# Patient Record
Sex: Female | Born: 1965 | Race: White | Hispanic: No | Marital: Married | Smoking: Never smoker
Health system: Southern US, Community
[De-identification: ages and names within clinical notes are randomized; demographics above are authoritative.]

## PROBLEM LIST (undated history)

## (undated) DIAGNOSIS — K219 Gastro-esophageal reflux disease without esophagitis: Secondary | ICD-10-CM

## (undated) DIAGNOSIS — M359 Systemic involvement of connective tissue, unspecified: Secondary | ICD-10-CM

## (undated) DIAGNOSIS — C801 Malignant (primary) neoplasm, unspecified: Secondary | ICD-10-CM

## (undated) DIAGNOSIS — J45909 Unspecified asthma, uncomplicated: Secondary | ICD-10-CM

## (undated) DIAGNOSIS — G43909 Migraine, unspecified, not intractable, without status migrainosus: Secondary | ICD-10-CM

## (undated) DIAGNOSIS — Z9889 Other specified postprocedural states: Secondary | ICD-10-CM

## (undated) DIAGNOSIS — I471 Supraventricular tachycardia, unspecified: Secondary | ICD-10-CM

## (undated) DIAGNOSIS — R002 Palpitations: Secondary | ICD-10-CM

## (undated) DIAGNOSIS — R87629 Unspecified abnormal cytological findings in specimens from vagina: Secondary | ICD-10-CM

## (undated) DIAGNOSIS — R112 Nausea with vomiting, unspecified: Secondary | ICD-10-CM

## (undated) HISTORY — DX: Systemic involvement of connective tissue, unspecified (CMS HCC): M35.9

## (undated) HISTORY — DX: Gastro-esophageal reflux disease without esophagitis: K21.9

## (undated) HISTORY — DX: Malignant (primary) neoplasm, unspecified (CMS HCC): C80.1

## (undated) HISTORY — DX: Migraine, unspecified, not intractable, without status migrainosus: G43.909

## (undated) HISTORY — DX: Unspecified asthma, uncomplicated: J45.909

## (undated) HISTORY — DX: Palpitations: R00.2

## (undated) HISTORY — PX: WISDOM TOOTH EXTRACTION: SHX21

## (undated) HISTORY — DX: Supraventricular tachycardia: I47.1

## (undated) HISTORY — DX: Supraventricular tachycardia, unspecified: I47.10

## (undated) HISTORY — DX: Unspecified abnormal cytological findings in specimens from vagina: R87.629

---

## 1989-01-07 ENCOUNTER — Other Ambulatory Visit (HOSPITAL_COMMUNITY): Payer: Self-pay

## 2002-09-18 ENCOUNTER — Other Ambulatory Visit: Admission: RE | Admit: 2002-09-18 | Discharge: 2002-09-18 | Payer: Self-pay | Admitting: Otolaryngology

## 2009-12-12 ENCOUNTER — Ambulatory Visit (HOSPITAL_COMMUNITY): Admission: RE | Admit: 2009-12-12 | Discharge: 2009-12-12 | Payer: Self-pay | Admitting: Orthopedic Surgery

## 2012-03-02 ENCOUNTER — Emergency Department (HOSPITAL_COMMUNITY)
Admission: EM | Admit: 2012-03-02 | Discharge: 2012-03-02 | Disposition: A | Discharge status: Home / Self Care | Payer: PRIVATE HEALTH INSURANCE | Attending: Emergency Medicine | Admitting: Emergency Medicine

## 2012-03-02 ENCOUNTER — Encounter (HOSPITAL_COMMUNITY): Payer: Self-pay | Admitting: *Deleted

## 2012-03-02 ENCOUNTER — Other Ambulatory Visit: Payer: Self-pay

## 2012-03-02 DIAGNOSIS — R0602 Shortness of breath: Secondary | ICD-10-CM | POA: Insufficient documentation

## 2012-03-02 DIAGNOSIS — T59891A Toxic effect of other specified gases, fumes and vapors, accidental (unintentional), initial encounter: Secondary | ICD-10-CM | POA: Insufficient documentation

## 2012-03-02 DIAGNOSIS — Y990 Civilian activity done for income or pay: Secondary | ICD-10-CM | POA: Insufficient documentation

## 2012-03-02 DIAGNOSIS — Y9210 Unspecified residential institution as the place of occurrence of the external cause: Secondary | ICD-10-CM | POA: Insufficient documentation

## 2012-03-02 DIAGNOSIS — Y93E5 Activity, floor mopping and cleaning: Secondary | ICD-10-CM | POA: Insufficient documentation

## 2012-03-02 DIAGNOSIS — T65891A Toxic effect of other specified substances, accidental (unintentional), initial encounter: Secondary | ICD-10-CM | POA: Insufficient documentation

## 2012-03-02 DIAGNOSIS — T1490XA Injury, unspecified, initial encounter: Secondary | ICD-10-CM

## 2012-03-02 NOTE — ED Notes (Signed)
Dr Ignacia Palma states he gave and instructed patient on discharge care and follow up. Patient left ED ambulatory with steady gait.

## 2012-03-02 NOTE — ED Notes (Signed)
Pt was working in or and sprayed some cleaning chemicals pt began to experience a fast hr, rate was 225 on monitor in or, sob, "splotching" noted to chest area that moved toward that facial area, vagal maneuvers were done in hr with decrease in hr, pt states that she is feeling better now.

## 2012-03-02 NOTE — ED Provider Notes (Signed)
History     CSN: [id number]  Arrival date & time 03/02/12  1234   None     Chief Complaint  Patient presents with  . Allergic Reaction    (Consider location/radiation/quality/duration/timing/severity/associated sxs/prior treatment) Patient is a 46 y.o. female presenting with general illness. The history is provided by the patient. No language interpreter was used.  Illness  The current episode started today (Pt was working in the [employer] and the room  was being cleaned by sprayinwith a cleaning solution calle [id number].). The problem has been resolved (After inhaling the spray by accident, pt had rapid heart rate of 225 with BP 132/80.  She did Valsalva maneuver, and her heart rate slowed down.). The problem is moderate. Relieved by: Valsalva maneuver. Nothing aggravates the symptoms. Pertinent negatives include no fever. She has received no recent medical care.    History reviewed. No pertinent past medical history.  History reviewed. No pertinent past surgical history.  No family history on file.  History  Substance Use Topics  . Smoking status: Never Smoker   . Smokeless tobacco: Not on file  . Alcohol Use: No    OB History    Grav Para Term Preterm Abortions TAB SAB Ect Mult Living                  Review of Systems  Constitutional: Negative for fever and chills.  HENT: Negative.   Eyes: Negative.   Respiratory: Positive for shortness of breath.   Cardiovascular: Positive for palpitations.  Gastrointestinal: Negative.   Genitourinary: Negative.   Musculoskeletal: Negative.   Neurological: Negative.   Psychiatric/Behavioral: Negative.     Allergies  Review of patient's allergies indicates no known allergies.  Home Medications  No current outpatient prescriptions on file.  BP 113/81  Pulse 84  Temp 97.5 F (36.4 C)  Resp 20  SpO2 100%  Physical Exam  Constitutional: She is oriented to person, place, and time. She appears well-developed and  well-nourished. No distress.  HENT:  Head: Normocephalic and atraumatic.  Right Ear: External ear normal.  Left Ear: External ear normal.  Eyes: Conjunctivae normal and EOM are normal. Pupils are equal, round, and reactive to light.  Neck: Normal range of motion. Neck supple.  Cardiovascular: Normal rate, regular rhythm and normal heart sounds.   Pulmonary/Chest: Effort normal and breath sounds normal.  Abdominal: Soft. Bowel sounds are normal.  Musculoskeletal: Normal range of motion. She exhibits no edema and no tenderness.  Neurological: She is alert and oriented to person, place, and time.       No sensory or motor deficit.  Skin: Skin is warm and dry.  Psychiatric: She has a normal mood and affect. Her behavior is normal.    ED Course  Procedures (including critical care time)  1:05 PM  Date: 03/02/2012  Rate: 94  Rhythm: normal sinus rhythm and sinus arrhythmia  QRS Axis: right  Intervals: normal  ST/T Wave abnormalities: normal  Conduction Disutrbances:none  Narrative Interpretation: Borderline EKG  Old EKG Reviewed: none available   1:50 PM Pt seen --> physical exam performed.  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  Pt can safely be discharged and can return to work.   1. Inhalation injury         Carleene Cooper III, MD 03/03/12 [phone]

## 2012-07-11 ENCOUNTER — Encounter: Payer: Self-pay | Admitting: Cardiology

## 2012-07-19 ENCOUNTER — Encounter: Payer: Self-pay | Admitting: Cardiology

## 2012-07-19 DIAGNOSIS — R002 Palpitations: Secondary | ICD-10-CM | POA: Insufficient documentation

## 2012-07-20 ENCOUNTER — Ambulatory Visit (INDEPENDENT_AMBULATORY_CARE_PROVIDER_SITE_OTHER): Payer: 59 | Admitting: Cardiology

## 2012-07-20 ENCOUNTER — Encounter: Payer: Self-pay | Admitting: Cardiology

## 2012-07-20 VITALS — BP 102/67 | HR 77 | Ht 64.0 in | Wt 156.0 lb

## 2012-07-20 DIAGNOSIS — I471 Supraventricular tachycardia: Secondary | ICD-10-CM | POA: Insufficient documentation

## 2012-07-20 NOTE — Progress Notes (Signed)
   Clinical Summary Tara Gates is a 47 y.o.female referred for cardiology consultation by Dr. Reuel Boom. She works in the OR at [employer]. Describes two separate episodes of prolonged rapid palpitations, one in October, and another more recently in January. Heart rate greater than 200 beats per minute, each episode broken with vagal maneuvers, and most recent event recorded on telemetry, suggestive of AVNRT. No 12-lead available of the events.  She states that she felt lightheaded and short of breath with these episodes, no frank syncope. Has experienced very brief palpitations previously, but nothing as profound as these most recent events. She is not on any specific prescription medications. Has noted more of a sense of palpitations with caffeine, has tried to cut this out of her diet.  Otherwise reports no regular functional limitations.   No Known Allergies  Current Outpatient Prescriptions  Medication Sig Dispense Refill  . naproxen sodium (ANAPROX) 220 MG tablet Take 220 mg by mouth 2 (two) times daily as needed.       No current facility-administered medications for this visit.    Past Medical History  Diagnosis Date  . Palpitations   . PSVT (paroxysmal supraventricular tachycardia)     Recently documented    History reviewed. No pertinent past surgical history.  Family History  Problem Relation Age of Onset  . Heart attack Father     Cause of death age 39  . Heart attack Mother     Age 70    Social History Ms. Val reports that she has never smoked. She does not have any smokeless tobacco history on file. Ms. Milberger reports that she does not drink alcohol.  Review of Systems Seasonal allergies. Occasional Headaches. Negative except as outlined.  Physical Examination Filed Vitals:   07/20/12 1026  BP: 102/67  Pulse: 77   Filed Weights   07/20/12 1026  Weight: 156 lb (70.761 kg)   Normally nourished appearing. No acute distress. HEENT: Conjunctiva and lids  normal, oropharynx clear. Neck: Supple, no elevated JVP or carotid bruits, no thyromegaly. Lungs: Clear to auscultation, nonlabored breathing at rest. Cardiac: Regular rate and rhythm, no S3 or significant systolic murmur, no pericardial rub. Extremities: No pitting edema, distal pulses 2+. Skin: Warm and dry. Musculoskeletal: No kyphosis. Neuropsychiatric: Alert and oriented x3, affect grossly appropriate.   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 Each episode (October and January) broken by bearing down/vagal maneuvers. We discussed options for management, she already limits caffeine. She is not inclined to take regular medication at this point. An echocardiogram will be obtained to assess cardiac structure and function, and we will have her see Dr. Ladona Ridgel for EP opinion regarding RF ablation if her symptoms worsen. Otherwise for now continue observation.    Jonelle Sidle, M.D., F.A.C.C.

## 2012-07-20 NOTE — Assessment & Plan Note (Signed)
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 Each episode (October and January) broken by bearing down/vagal maneuvers. We discussed options for management, she already limits caffeine. She is not inclined to take regular medication at this point. An echocardiogram will be obtained to assess cardiac structure and function, and we will have her see Dr. Ladona Ridgel for EP opinion regarding RF ablation if her symptoms worsen. Otherwise for now continue observation.

## 2012-07-20 NOTE — Patient Instructions (Signed)
   Echo - [facility])  Referral to Dr. Ladona Ridgel  Continue all current medications. Follow up as needed

## 2012-07-26 ENCOUNTER — Ambulatory Visit (HOSPITAL_COMMUNITY)
Admission: RE | Admit: 2012-07-26 | Discharge: 2012-07-26 | Disposition: A | Discharge status: Home / Self Care | Payer: 59 | Source: Ambulatory Visit | Attending: Cardiology | Admitting: Cardiology

## 2012-07-26 DIAGNOSIS — R002 Palpitations: Secondary | ICD-10-CM | POA: Insufficient documentation

## 2012-07-26 DIAGNOSIS — I319 Disease of pericardium, unspecified: Secondary | ICD-10-CM

## 2012-07-26 DIAGNOSIS — I471 Supraventricular tachycardia, unspecified: Secondary | ICD-10-CM | POA: Insufficient documentation

## 2012-07-26 NOTE — Progress Notes (Signed)
*  PRELIMINARY RESULTS* Echocardiogram 2D Echocardiogram has been performed.  Conrad Shackelford 07/26/2012, 10:33 AM

## 2012-07-28 ENCOUNTER — Telehealth: Payer: Self-pay | Admitting: *Deleted

## 2012-07-28 NOTE — Telephone Encounter (Signed)
Message copied by Eustace Moore on Fri Jul 28, 2012  5:00 PM ------      Message from: Jonelle Sidle      Created: Fri Jul 28, 2012  9:21 AM       Reviewed report. Normal LVEF, no major valvular abnormalities, trivial pericardial effusion (doubt clinically significant). Keep plan for followup with Dr. Ladona Ridgel. ------

## 2012-07-28 NOTE — Telephone Encounter (Signed)
Patient informed. 

## 2012-09-04 ENCOUNTER — Ambulatory Visit: Payer: Self-pay | Admitting: Internal Medicine

## 2013-04-16 ENCOUNTER — Telehealth: Payer: Self-pay | Admitting: Cardiology

## 2013-04-16 MED ORDER — PROPRANOLOL HCL 10 MG PO TABS: 10.0000 mg | ORAL_TABLET | Freq: Three times a day (TID) | ORAL | Status: DC | PRN

## 2013-04-16 NOTE — Telephone Encounter (Signed)
Patient informed. 

## 2013-04-16 NOTE — Telephone Encounter (Signed)
Patient called to inform cardiologist that she had an episode for the 1st time since February 2014. Patient says her was ranging between HR 220-230. No c/o chest pain or sob. Patient is requesting medication to have on hand for as needed only. Patient informed nurse that she did not want to proceed with an ablation at this time and this is why she canceled her appointment with Dr. Ladona Ridgel back in April 2014.

## 2013-04-16 NOTE — Telephone Encounter (Signed)
Can try Propranolol 10 mg PO TID PRN palpitations. She should schedule follow-up sooner in symptoms progress.

## 2013-05-23 ENCOUNTER — Ambulatory Visit: Payer: Self-pay | Admitting: Internal Medicine

## 2013-05-29 ENCOUNTER — Encounter: Payer: Self-pay | Admitting: *Deleted

## 2013-07-25 DIAGNOSIS — M3130 Wegener's granulomatosis without renal involvement: Secondary | ICD-10-CM | POA: Insufficient documentation

## 2013-07-25 DIAGNOSIS — R490 Dysphonia: Secondary | ICD-10-CM | POA: Insufficient documentation

## 2014-07-29 ENCOUNTER — Encounter (HOSPITAL_COMMUNITY): Payer: Self-pay | Admitting: Emergency Medicine

## 2014-07-29 ENCOUNTER — Emergency Department (HOSPITAL_COMMUNITY): Payer: 59

## 2014-07-29 ENCOUNTER — Emergency Department (HOSPITAL_COMMUNITY)
Admission: EM | Admit: 2014-07-29 | Discharge: 2014-07-29 | Disposition: A | Discharge status: Home / Self Care | Payer: 59 | Attending: Emergency Medicine | Admitting: Emergency Medicine

## 2014-07-29 DIAGNOSIS — R000 Tachycardia, unspecified: Secondary | ICD-10-CM | POA: Diagnosis present

## 2014-07-29 DIAGNOSIS — I471 Supraventricular tachycardia: Secondary | ICD-10-CM | POA: Diagnosis not present

## 2014-07-29 DIAGNOSIS — J302 Other seasonal allergic rhinitis: Secondary | ICD-10-CM | POA: Diagnosis not present

## 2014-07-29 LAB — CBC WITH DIFFERENTIAL/PLATELET
BASOS PCT: 1 % (ref 0–1)
Basophils Absolute: 0.1 10*3/uL (ref 0.0–0.1)
EOS ABS: 0.1 10*3/uL (ref 0.0–0.7)
EOS PCT: 1 % (ref 0–5)
HCT: 37.9 % (ref 36.0–46.0)
Hemoglobin: 12.5 g/dL (ref 12.0–15.0)
LYMPHS ABS: 1.7 10*3/uL (ref 0.7–4.0)
Lymphocytes Relative: 18 % (ref 12–46)
MCH: 30.3 pg (ref 26.0–34.0)
MCHC: 33 g/dL (ref 30.0–36.0)
MCV: 92 fL (ref 78.0–100.0)
Monocytes Absolute: 0.6 10*3/uL (ref 0.1–1.0)
Monocytes Relative: 7 % (ref 3–12)
NEUTROS PCT: 73 % (ref 43–77)
Neutro Abs: 6.8 10*3/uL (ref 1.7–7.7)
PLATELETS: 236 10*3/uL (ref 150–400)
RBC: 4.12 MIL/uL (ref 3.87–5.11)
RDW: 13.1 % (ref 11.5–15.5)
WBC: 9.2 10*3/uL (ref 4.0–10.5)

## 2014-07-29 LAB — TROPONIN I

## 2014-07-29 LAB — COMPREHENSIVE METABOLIC PANEL
ALBUMIN: 3.5 g/dL (ref 3.5–5.2)
ALT: 12 U/L (ref 0–35)
ANION GAP: 7 (ref 5–15)
AST: 16 U/L (ref 0–37)
Alkaline Phosphatase: 64 U/L (ref 39–117)
BILIRUBIN TOTAL: 0.3 mg/dL (ref 0.3–1.2)
BUN: 13 mg/dL (ref 6–23)
CALCIUM: 8.8 mg/dL (ref 8.4–10.5)
CO2: 24 mmol/L (ref 19–32)
CREATININE: 0.91 mg/dL (ref 0.50–1.10)
Chloride: 108 mmol/L (ref 96–112)
GFR calc Af Amer: 86 mL/min — ABNORMAL LOW (ref >90)
GFR, EST NON AFRICAN AMERICAN: 74 mL/min — AB (ref >90)
Glucose, Bld: 90 mg/dL (ref 70–99)
Potassium: 3.8 mmol/L (ref 3.5–5.1)
SODIUM: 139 mmol/L (ref 135–145)
Total Protein: 6.9 g/dL (ref 6.0–8.3)

## 2014-07-29 LAB — TSH: TSH: 2.152 u[IU]/mL (ref 0.350–4.500)

## 2014-07-29 MED ORDER — METOPROLOL TARTRATE 25 MG PO TABS: 12.5000 mg | ORAL_TABLET | Freq: Two times a day (BID) | ORAL | Status: DC

## 2014-07-29 MED ORDER — ATENOLOL 25 MG PO TABS
25.0000 mg | ORAL_TABLET | Freq: Once | ORAL | Status: AC
  Administered 2014-07-29: 25 mg via ORAL
  Filled 2014-07-29: qty 1

## 2014-07-29 NOTE — Discharge Instructions (Signed)
Follow up with your cardiologist in 1-4 weeks

## 2014-07-29 NOTE — ED Notes (Addendum)
Pt reports was at work and reports "felt my heart rate increase and extremities go weak." pt alert and oriented. Pt denies pain, sob. Pt reports history of same. Pt reports bared down and blew through a straw prior to arrival. Pt reports is prescribed propanolol PRN but reports did not take dose prior to arrival.

## 2014-07-29 NOTE — Consult Note (Addendum)
CARDIOLOGY CONSULT NOTE   Patient ID: BENIGNA DELISI MRN: [id number] DOB/AGE: 1965-11-22 49 y.o.  Admit Date: 07/29/2014 Referring Physician: ER Primary Physician: Gar Ponto, MD Consulting Cardiologist: Kate Sable Primary Cardiologist Rozann Lesches MD Reason for Consultation: PSVT  Clinical Summary Ms. 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 Domenic Polite when seen last in March of 2014. She has not been seen since and has not had recurrences. She was at work this am when she had sudden onset of rapid HR, tingling and numbness in extremities, and dyspnea. She attached cardiac monitor and found her HR to up elevated up to 170-190. She has had episodes in the past up to >200. She occasionally feels HR go up at work, but it quickly returns to normal.   When assessed in PACU she has already been placed on O2 via mask. HR was down to 110 bpm. She was feeling better. No chest pain, no nausea or vomiting.    No Known Allergies  Medications Scheduled Medications:   Infusions:   PRN Medications:     Past Medical History  Diagnosis Date  . Palpitations   . PSVT (paroxysmal supraventricular tachycardia)     Recently documented    History reviewed. No pertinent past surgical history.  Family History  Problem Relation Age of Onset  . Heart attack Father     Cause of death age 67  . Heart attack Mother     Age 90    Social History Ms. Willets reports that she has never smoked. She does not have any smokeless tobacco history on file. Ms. Latendresse reports that she does not drink alcohol.  Review of Systems Complete review of systems are found to be negative unless outlined in H&P above.  Physical Examination Blood pressure 103/70, pulse 105, temperature 98.5 F (36.9 C), temperature source Oral, resp. rate 18, height 5\' 5"  (1.651 m), weight 150 lb (68.04 kg), last  menstrual period 06/30/2014, SpO2 100 %. No intake or output data in the 24 hours ending 07/29/14 1010  Telemetry: Sinus tachycardia rate of 90-110 bpm.   GEN: Resting no acute distress. Can still feel HR up.  HEENT: Conjunctiva and lids normal, oropharynx clear with moist mucosa. Neck: Supple, no elevated JVP or carotid bruits, questionable thyromegaly. Lungs: Clear to auscultation, nonlabored breathing at rest. Cardiac: Regular rate and rhythm, tachycardia, no S3 or significant systolic murmur, no pericardial rub. Abdomen: Soft, nontender, no hepatomegaly, bowel sounds present, no guarding or rebound. Extremities: No pitting edema, distal pulses 2+. Skin: Warm and dry. Musculoskeletal: No kyphosis. Neuropsychiatric: Alert and oriented x3, affect grossly appropriate.  Prior Cardiac Testing/Procedures 1.Echocardiogram 07/26/2012 Left ventricle: The cavity size was normal. Wall thickness was normal. The estimated ejection fraction was 60%. Wall motion was normal; there were no regional wall motion abnormalities. - Pericardium, extracardiac: Small pericardial effusion. There is slight fluid posteriorly. Small fluid along RA and RV.  Lab Results  Basic Metabolic Panel: No results for input(s): NA, K, CL, CO2, GLUCOSE, BUN, CREATININE, CALCIUM, MG, PHOS in the last 168 hours.  Liver Function Tests: No results for input(s): AST, ALT, ALKPHOS, BILITOT, PROT, ALBUMIN in the last 168 hours.  CBC:  Recent Labs Lab 07/29/14 0947  WBC 9.2  NEUTROABS 6.8  HGB 12.5  HCT 37.9  MCV 92.0  PLT 236    Radiology: Dg Chest 2 View  07/29/2014   CLINICAL DATA:  Tachycardia  EXAM: CHEST  2 VIEW  COMPARISON:  None.  FINDINGS: The heart size and mediastinal contours are within normal limits. Both lungs are clear. The visualized skeletal structures are unremarkable.  IMPRESSION: No active cardiopulmonary disease.   Electronically Signed   By: Inez Catalina M.D.   On: 07/29/2014 10:06       ECG: Sinus tachycardia rate of 100 bpm.   Impression and Recommendations  1. PSVT:  Occurred suddenly while at work in PACU. Has had episodes of this almost daily, but states that they are transient. Last severe episode 2 years ago. Has propanolol 10 mg to take prn but has not had to. Labs are normal, but TSH is pending. Will plan follow up with Dr. Domenic Polite and probable referral to EP. Can consider cardiac monitor as she states that she has these almost daily although they are not sustained.   2.Questionable thyromegaly: No trouble swallowing. Will check TSH.   Signed: Phill Myron. Lawrence NP AACC  07/29/2014, 10:10 AM Co-Sign MD  The patient was seen and examined, and I agree with the assessment and plan as documented above, with modifications as noted below. 49 yr old woman with no significant PMH other than PSVT and seasonal allergies found to be in SVT. She did not have time to take prn propranolol as it came on suddenly and she nearly passed out. She denies fevers/chills but has been bothered by seasonal allergies since Friday, and took Claritin yesterday. CXR, BMET, and CBC were unremarkable. She is scared and has not informed her husband or 2 daughters of today's events, but did call her mother who is here. I had a long discussion with her regarding various management options including the prn use of medications, scheduled medications, and EPS with ablation. 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 If she were to experience feelings of sluggishness/fatigue, I would then try metoprolol 12.5 mg daily. The patient is in agreement with this strategy for the time being.  She does not require hospitalization.

## 2014-07-29 NOTE — ED Provider Notes (Addendum)
CSN: [id number]     Arrival date & time 07/29/14  0902 History  This chart was scribed for Milton Ferguson, MD by Stephania Fragmin, ED Scribe. This patient was seen in room APA08/APA08 and the patient's care was started at 9:23 AM.   Chief Complaint  Patient presents with  . Tachycardia   Patient is a 49 y.o. female presenting with palpitations. The history is provided by the patient. No language interpreter was used.  Palpitations Palpitations quality:  Fast Onset quality:  Sudden Duration:  1 hour Timing:  Intermittent Progression:  Improving Chronicity:  Recurrent Context comment:  At work Relieved by:  Nothing Worsened by:  Nothing Ineffective treatments:  None tried Associated symptoms: weakness   Associated symptoms: no back pain, no chest pain and no cough      HPI Comments: DELCENIA INMAN is a 49 y.o. female with a history of PSVT with who presents to the Emergency Department complaining of sudden onset tachycardia at work that occurred PTA. 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 Patient reports regular, almost daily, brief episodes of tachycardia at work, but they are usually not as long-lasting nor does her pulse become as high as 150-170 bpm. She takes propanolol prn, but hasn't taken any because she hasn't had SVT in 2 years. She last saw her cardiologist, Dr. Domenic Polite, 2 years ago.  Past Medical History  Diagnosis Date  . Palpitations   . PSVT (paroxysmal supraventricular tachycardia)     Recently documented   History reviewed. No pertinent past surgical history. Family History  Problem Relation Age of Onset  . Heart attack Father     Cause of death age 103  . Heart attack Mother     Age 62   History  Substance Use Topics  . Smoking status: Never Smoker   . Smokeless tobacco: Not on file  . Alcohol Use: No   OB History    No data available      Review of Systems  Constitutional: Negative for appetite change and fatigue.  HENT: Negative for congestion, ear discharge and sinus pressure.   Eyes: Negative for discharge.  Respiratory: Negative for cough.   Cardiovascular: Positive for palpitations. Negative for chest pain.  Gastrointestinal: Negative for abdominal pain and diarrhea.  Genitourinary: Negative for frequency and hematuria.  Musculoskeletal: Negative for back pain.  Skin: Negative for rash.  Neurological: Positive for weakness. Negative for seizures and headaches.  Psychiatric/Behavioral: Negative for hallucinations.      Allergies  Review of patient's allergies indicates no known allergies.  Home Medications   Prior to Admission medications   Medication Sig Start Date End Date Taking? Authorizing Provider  naproxen sodium (ANAPROX) 220 MG tablet Take 220 mg by mouth 2 (two) times daily as needed.    Historical Provider, MD  propranolol (INDERAL) 10 MG tablet Take 1 tablet (10 mg total) by mouth 3 (three) times daily as needed. Palpitations 04/16/13   Satira Sark, MD   BP 109/77 mmHg  Pulse 110  Temp(Src) 98.5 F (36.9 C) (Oral)  Resp 18  Ht 5\' 5"  (1.651 m)  Wt 150 lb (68.04 kg)  BMI 24.96 kg/m2  SpO2 100%  LMP 06/30/2014 Physical Exam  Constitutional: She is oriented to person, place, and time. She appears well-developed.  HENT:  Head: Normocephalic.  Eyes: Conjunctivae and EOM are normal. No scleral  icterus.  Neck: Neck supple. No thyromegaly present.  Cardiovascular: Regular rhythm.  Tachycardia present.  Exam reveals no gallop and no friction rub.   No murmur heard. Mild tachycardia.  Pulmonary/Chest: No stridor. She has no wheezes. She has no rales. She exhibits no tenderness.  Abdominal: She exhibits no distension. There is no tenderness. There is no rebound.  Musculoskeletal: Normal range of motion. She exhibits no edema.  Lymphadenopathy:    She has no cervical adenopathy.   Neurological: She is oriented to person, place, and time. She exhibits normal muscle tone. Coordination normal.  Skin: No rash noted. No erythema.  Psychiatric: She has a normal mood and affect. Her behavior is normal.  Nursing note and vitals reviewed.   ED Course  Procedures (including critical care time)  DIAGNOSTIC STUDIES: Oxygen Saturation is 100% on room air, normal by my interpretation.    COORDINATION OF CARE: 9:26 AM - Discussed treatment plan with pt at bedside which includes atenolol, and pt agreed to plan.   Medications  atenolol (TENORMIN) tablet 25 mg (25 mg Oral Given 07/29/14 0942)    Labs Review Labs Reviewed  COMPREHENSIVE METABOLIC PANEL - Abnormal; Notable for the following:    GFR calc non Af Amer 74 (*)    GFR calc Af Amer 86 (*)    All other components within normal limits  CBC WITH DIFFERENTIAL/PLATELET  TROPONIN I  TSH    Imaging Review Dg Chest 2 View  07/29/2014   CLINICAL DATA:  Tachycardia  EXAM: CHEST  2 VIEW  COMPARISON:  None.  FINDINGS: The heart size and mediastinal contours are within normal limits. Both lungs are clear. The visualized skeletal structures are unremarkable.  IMPRESSION: No active cardiopulmonary disease.   Electronically Signed   By: Inez Catalina M.D.   On: 07/29/2014 10:06     EKG Interpretation   Date/Time:  Monday July 29 2014 09:07:09 EDT Ventricular Rate:  106 PR Interval:  124 QRS Duration: 81 QT Interval:  321 QTC Calculation: 426 R Axis:   99 Text Interpretation:  Sinus tachycardia Borderline right axis deviation  Confirmed by Kaleisha Bhargava  MD, Jaeley Wiker ([id number]) on 07/29/2014 1:41:58 PM      MDM   Final diagnoses:  None  pt seen by cards.  Start metoprolol bid and follow up with cards The chart was scribed for me under my direct supervision.  I personally performed the history, physical, and medical decision making and all procedures in the evaluation of this patient.Milton Ferguson, MD 07/29/14  1337  Milton Ferguson, MD 07/29/14 [phone]

## 2014-08-21 ENCOUNTER — Encounter: Payer: Self-pay | Admitting: Cardiovascular Disease

## 2014-08-21 ENCOUNTER — Ambulatory Visit (INDEPENDENT_AMBULATORY_CARE_PROVIDER_SITE_OTHER): Payer: 59 | Admitting: Cardiovascular Disease

## 2014-08-21 VITALS — BP 100/60 | HR 76 | Ht 65.0 in | Wt 154.0 lb

## 2014-08-21 DIAGNOSIS — Z7189 Other specified counseling: Secondary | ICD-10-CM | POA: Diagnosis not present

## 2014-08-21 DIAGNOSIS — Z9289 Personal history of other medical treatment: Secondary | ICD-10-CM

## 2014-08-21 DIAGNOSIS — Z87898 Personal history of other specified conditions: Secondary | ICD-10-CM

## 2014-08-21 DIAGNOSIS — I471 Supraventricular tachycardia: Secondary | ICD-10-CM | POA: Diagnosis not present

## 2014-08-21 MED ORDER — METOPROLOL TARTRATE 25 MG PO TABS: 12.5000 mg | ORAL_TABLET | Freq: Every day | ORAL | Status: DC

## 2014-08-21 NOTE — Patient Instructions (Addendum)
Your physician recommends that you schedule a follow-up appointment in: 3 months with Dr. Bronson Ing   DECREASE Lopressor to 12.5 mg at bedtime   We will refer you to Dr Virl Axe in [place]  Thank you for choosing [facility] !

## 2014-08-21 NOTE — Progress Notes (Signed)
Patient ID: Tara Gates, female   DOB: November 24, 1965, 49 y.o.   MRN: [id number]      SUBJECTIVE: The patient presents for post emergency room evaluation follow-up. I consulted on her for paroxysmal supraventricular tachycardia on 07/29/14. At that time we discussed various management strategies including medical therapy and ablation, of which she preferred the former. She has been on metoprolol 12.5 mg twice daily. She is here with her daughter, Tara Gates, who is graduating nursing school in May and will be working in the CCU at [employer].  She has felt somewhat fatigued on the current dose of metoprolol. She tried taking 12.5 mg every evening and felt better. She denies palpitations, shortness of breath, chest pain, lightheadedness, leg swelling, and abdominal discomfort.    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 2 daughters, another who works at [employer] as an Therapist, sports as well. Married.  Review of Systems: As per "subjective", otherwise negative.  No Known Allergies  Current Outpatient Prescriptions  Medication Sig Dispense Refill  . metoprolol (LOPRESSOR) 25 MG tablet Take 0.5 tablets (12.5 mg total) by mouth 2 (two) times daily. 30 tablet 1  . naproxen sodium (ANAPROX) 220 MG tablet Take 220 mg by mouth 2 (two) times daily as needed (headache).      No current facility-administered medications for this visit.    Past Medical History  Diagnosis Date  . Palpitations   . PSVT (paroxysmal supraventricular tachycardia)     Recently documented    No past surgical history on file.  History   Social History  . Marital Status: Married    Spouse Name: N/A  . Number of Children: N/A  . Years of Education: N/A   Occupational History  . Not on file.   Social History Main Topics  . Smoking status: Never Smoker   . Smokeless tobacco: Not on file  . Alcohol Use: No  . Drug Use: No  . Sexual Activity: Not on file   Other Topics Concern  . Not on file   Social History Narrative      Filed Vitals:   08/21/14 0833  BP: 100/60  Pulse: 76  Height: 5\' 5"  (1.651 m)  Weight: 154 lb (69.854 kg)  SpO2: 99%    PHYSICAL EXAM General: NAD HEENT: Normal. Neck: No JVD, no thyromegaly. Lungs: Clear to auscultation bilaterally with normal respiratory effort. CV: Nondisplaced PMI.  Regular rate and rhythm, normal S1/S2, no S3/S4, no murmur. No pretibial or periankle edema.  No carotid bruit.  Normal pedal pulses.  Abdomen: Soft, nontender, no hepatosplenomegaly, no distention.  Neurologic: Alert and oriented x 3.  Psych: Normal affect. Skin: Normal. Musculoskeletal: Normal range of motion, no gross deformities. Extremities: No clubbing or cyanosis.   ECG: Most recent ECG reviewed.      ASSESSMENT AND PLAN: 1. PSVT: Symptomatically stable on metoprolol 12.5 mg bid but somewhat fatigued. Will reduce to 12.5 mg every evening. She is now ready to at least consider ablation. We had a very lengthy discussion regarding methods and outcomes. She is going on a 2-week trip to [place] with her family after her daughter graduates. She requests to see Dr. Jolyn Nap. I will arrange for her to see him in [place] at our [facility] office.  Dispo: f/u 3 months.  Kate Sable, M.D., F.A.C.C.

## 2014-08-26 ENCOUNTER — Ambulatory Visit (INDEPENDENT_AMBULATORY_CARE_PROVIDER_SITE_OTHER): Payer: 59 | Admitting: Internal Medicine

## 2014-08-26 ENCOUNTER — Encounter: Payer: Self-pay | Admitting: *Deleted

## 2014-08-26 ENCOUNTER — Encounter: Payer: Self-pay | Admitting: Internal Medicine

## 2014-08-26 VITALS — BP 98/60 | HR 68 | Ht 65.0 in | Wt 155.2 lb

## 2014-08-26 DIAGNOSIS — I471 Supraventricular tachycardia: Secondary | ICD-10-CM

## 2014-08-26 NOTE — Assessment & Plan Note (Signed)
I have discussed the treatment options. She has symptomatic SVT and has tolerated poorly beta blocker therapy. I have discussed the risks/benefits/goals/expectations of catheter ablation of SVT with the patient and she is considering her options and will call us if she would like to proceed with ablation.

## 2014-08-26 NOTE — Progress Notes (Signed)
      HPI [name] is referred today for evaluation of SVT. She is a pleasant 49 yo woman with a h/o palpitations dating back approx. 4 years. She has had multiple episodes of heart racing which always appear to occur while working. The episodes start and stop suddenly and are associated with sob and near syncope. She has had a documented short RP tachycardia at over 200/min. She was place on low dose beta blocker therapy and feels tired and washed out though she has not had more SVT. Denies excess caffein or ETOH. No Known Allergies   Current Outpatient Prescriptions  Medication Sig Dispense Refill  . metoprolol tartrate (LOPRESSOR) 25 MG tablet Take 0.5 tablets (12.5 mg total) by mouth at bedtime. 45 tablet 3  . naproxen sodium (ANAPROX) 220 MG tablet Take 220 mg by mouth 2 (two) times daily as needed (headache).      No current facility-administered medications for this visit.     Past Medical History  Diagnosis Date  . Palpitations   . PSVT (paroxysmal supraventricular tachycardia)     Recently documented    ROS:   All systems reviewed and negative except as noted in the HPI.   No past surgical history on file.   Family History  Problem Relation Age of Onset  . Heart attack Father     Cause of death age 25  . Heart attack Mother     Age 27     History   Social History  . Marital Status: Married    Spouse Name: N/A  . Number of Children: N/A  . Years of Education: N/A   Occupational History  . Not on file.   Social History Main Topics  . Smoking status: Never Smoker   . Smokeless tobacco: Not on file  . Alcohol Use: No  . Drug Use: No  . Sexual Activity: Not on file   Other Topics Concern  . Not on file   Social History Narrative     BP 98/60 mmHg  Pulse 68  Ht 5\' 5"  (1.651 m)  Wt 155 lb 4 oz (70.421 kg)  BMI 25.83 kg/m2  LMP 06/30/2014  Physical Exam:  Well appearing middle aged woman, NAD HEENT: Unremarkable Neck:  No JVD, no  thyromegally Lymphatics:  No adenopathy Back:  No CVA tenderness Lungs:  Clear with no wheezes HEART:  Regular rate rhythm, no murmurs, no rubs, no clicks Abd:  soft, positive bowel sounds, no organomegally, no rebound, no guarding Ext:  2 plus pulses, no edema, no cyanosis, no clubbing Skin:  No rashes no nodules Neuro:  CN II through XII intact, motor grossly intact  EKG - reviewed demonstrating no ventricular pre-excitation.  Assess/Plan:

## 2014-08-26 NOTE — Patient Instructions (Signed)
Your physician has recommended that you have an ablation. Catheter ablation is a medical procedure used to treat some cardiac arrhythmias (irregular heartbeats). During catheter ablation, a long, thin, flexible tube is put into a blood vessel in your groin (upper thigh), or neck. This tube is called an ablation catheter. It is then guided to your heart through the blood vessel. Radio frequency waves destroy small areas of heart tissue where abnormal heartbeats may cause an arrhythmia to start. Available dates are : May 2, May 5, May, 6, May 9, May 11, May 16 , May 23, May 25  June 2, June 8, June 10, June 14, June 16, June 22, June 29  These dates do fill quickly so please choose more than one date and call us with those dates.   Thank you for choosing [facility]!  Cardiac Ablation Cardiac ablation is a procedure to disable a small amount of heart tissue in very specific places. The heart has many electrical connections. Sometimes these connections are abnormal and can cause the heart to beat very fast or irregularly. By disabling some of the problem areas, heart rhythm can be improved or made normal. Ablation is done for people who:   Have Wolff-Parkinson-White syndrome.   Have other fast heart rhythms (tachycardia).   Have taken medicines for an abnormal heart rhythm (arrhythmia) that resulted in:   No success.   Side effects.   May have a high-risk heartbeat that could result in death.  LET [facility] CARE PROVIDER KNOW ABOUT:   Any allergies you have or any previous reactions you have had to X-ray dye, food (such as seafood), medicine, or tape.   All medicines you are taking, including vitamins, herbs, eye drops, creams, and over-the-counter medicines.   Previous problems you or members of your family have had with the use of anesthetics.   Any blood disorders you have.   Previous surgeries or procedures (such as a kidney transplant) you have had.    Medical conditions you have (such as kidney failure).  RISKS AND COMPLICATIONS Generally, cardiac ablation is a safe procedure. However, problems can occur and include:   Increased risk of cancer. Depending on how long it takes to do the ablation, the dose of radiation can be high.  Bruising and bleeding where a thin, flexible tube (catheter) was inserted during the procedure.   Bleeding into the chest, especially into the sac that surrounds the heart (serious).  Need for a permanent pacemaker if the normal electrical system is damaged.   The procedure may not be fully effective, and this may not be recognized for months. Repeat ablation procedures are sometimes required. BEFORE THE PROCEDURE   Follow any instructions from your health care provider regarding eating and drinking before the procedure.   Take your medicines as directed at regular times with water, unless instructed otherwise by your health care provider. If you are taking diabetes medicine, including insulin, ask how you are to take it and if there are any special instructions you should follow. It is common to adjust insulin dosing the day of the ablation.  PROCEDURE  An ablation is usually performed in a catheterization laboratory with the guidance of fluoroscopy. Fluoroscopy is a type of X-ray that helps your health care provider see images of your heart during the procedure.   An ablation is a minimally invasive procedure. This means a small cut (incision) is made in either your neck or groin. Your health care provider will decide where  to make the incision based on your medical history and physical exam.  An IV tube will be started before the procedure begins. You will be given an anesthetic or medicine to help you relax (sedative).  The skin on your neck or groin will be numbed. A needle will be inserted into a large vein in your neck or groin and catheters will be threaded to your heart.  A special dye  that shows up on fluoroscopy pictures may be injected through the catheter. The dye helps your health care provider see the area of the heart that needs treatment.  The catheter has electrodes on the tip. When the area of heart tissue that is causing the arrhythmia is found, the catheter tip will send an electrical current to the area and "scar" the tissue. Three types of energy can be used to ablate the heart tissue:   Heat (radiofrequency energy).   Laser energy.   Extreme cold (cryoablation).   When the area of the heart has been ablated, the catheter will be taken out. Pressure will be held on the insertion site. This will help the insertion site clot and keep it from bleeding. A bandage will be placed on the insertion site.  AFTER THE PROCEDURE   After the procedure, you will be taken to a recovery area where your vital signs (blood pressure, heart rate, and breathing) will be monitored. The insertion site will also be monitored for bleeding.   You will need to lie still for 4-6 hours. This is to ensure you do not bleed from the catheter insertion site.  Document Released: 09/12/2008 Document Revised: 09/10/2013 Document Reviewed: 09/18/2012 [facility] Patient Information 2015 [place], [place]. This information is not intended to replace advice given to you by your health care provider. Make sure you discuss any questions you have with your health care provider.

## 2014-08-26 NOTE — Addendum Note (Signed)
Addended by: Levonne Hubert on: 08/26/2014 12:19 PM   Modules accepted: Level of Service

## 2014-09-05 ENCOUNTER — Telehealth: Payer: Self-pay | Admitting: Internal Medicine

## 2014-09-05 NOTE — Telephone Encounter (Signed)
Patient states that her procedure had been cancelled per pre-service center.  Patient was not aware of test cancelled. Please advise.  / tg

## 2014-09-05 NOTE — Telephone Encounter (Signed)
Test has not been cancelled. Pt aware.

## 2014-09-06 ENCOUNTER — Emergency Department (HOSPITAL_COMMUNITY)
Admission: EM | Admit: 2014-09-06 | Discharge: 2014-09-06 | Disposition: A | Discharge status: Home / Self Care | Payer: 59 | Attending: Emergency Medicine | Admitting: Emergency Medicine

## 2014-09-06 ENCOUNTER — Telehealth: Payer: Self-pay | Admitting: Cardiovascular Disease

## 2014-09-06 ENCOUNTER — Encounter (HOSPITAL_COMMUNITY): Payer: Self-pay | Admitting: Emergency Medicine

## 2014-09-06 DIAGNOSIS — I471 Supraventricular tachycardia: Secondary | ICD-10-CM | POA: Diagnosis not present

## 2014-09-06 DIAGNOSIS — R000 Tachycardia, unspecified: Secondary | ICD-10-CM | POA: Diagnosis present

## 2014-09-06 MED ORDER — METOPROLOL TARTRATE 25 MG PO TABS
25.0000 mg | ORAL_TABLET | Freq: Once | ORAL | Status: AC
  Administered 2014-09-06: 25 mg via ORAL
  Filled 2014-09-06: qty 1

## 2014-09-06 NOTE — Discharge Instructions (Signed)
Supraventricular Tachycardia °Supraventricular tachycardia (SVT) is an abnormal heart rhythm (arrhythmia) that causes the heart to beat very fast (tachycardia). This kind of fast heartbeat originates in the upper chambers of the heart (atria). SVT can cause the heart to beat greater than 100 beats per minute. SVT can have a rapid burst of heartbeats. This can start and stop suddenly without warning and is called nonsustained. SVT can also be sustained, in which the heart beats at a continuous fast rate.  °CAUSES  °There can be different causes of SVT. Some of these include: °· Heart valve problems such as mitral valve prolapse. °· An enlarged heart (hypertrophic cardiomyopathy). °· Congenital heart problems. °· Heart inflammation (pericarditis). °· Hyperthyroidism. °· Low potassium or magnesium levels. °· Caffeine. °· Drug use such as cocaine, methamphetamines, or stimulants. °· Some over-the-counter medicines such as: °¨ Decongestants. °¨ Diet medicines. °¨ Herbal medicines. °SYMPTOMS  °Symptoms of SVT can vary. Symptoms depend on whether the SVT is sustained or nonsustained. You may experience: °· No symptoms (asymptomatic). °· An awareness of your heart beating rapidly (palpitations). °· Shortness of breath. °· Chest pain or pressure. °If your blood pressure drops because of the SVT, you may experience: °· Fainting or near fainting. °· Weakness. °· Dizziness. °DIAGNOSIS  °Different tests can be performed to diagnose SVT, such as: °· An electrocardiogram (EKG). This is a painless test that records the electrical activity of your heart. °· Holter monitor. This is a 24 hour recording of your heart rhythm. You will be given a diary. Write down all symptoms that you have and what you were doing at the time you experienced symptoms. °· Arrhythmia monitor. This is a small device that your wear for several weeks. It records the heart rhythm when you have symptoms. °· Echocardiogram. This is an imaging test to help detect  abnormal heart structure such as congenital abnormalities, heart valve problems, or heart enlargement. °· Stress test. This test can help determine if the SVT is related to exercise. °· Electrophysiology study (EPS). This is a procedure that evaluates your heart's electrical system and can help your caregiver find the cause of your SVT. °TREATMENT  °Treatment of SVT depends on the symptoms, how often it recurs, and whether there are any underlying heart problems.  °· If symptoms are rare and no other cardiac disease is present, no treatment may be needed. °· Blood work may be done to check potassium, magnesium, and thyroid hormone levels to see if they are abnormal. If these levels are abnormal, treatment to correct the problems will occur. °Medicines °Your caregiver may use oral medicines to treat SVT. These medicines are given for long-term control of SVT. Medicines may be used alone or in combination with other treatments. These medicines work to slow nerve impulses in the heart muscle. These medicines can also be used to treat high blood pressure. Some of these medicines may include: °· Calcium channel blockers. °· Beta blockers. °· Digoxin. °Nonsurgical procedures °Nonsurgical techniques may be used if oral medicines do not work. Some examples include: °· Cardioversion. This technique uses either drugs or an electrical shock to restore a normal heart rhythm. °¨ Cardioversion drugs may be given through an intravenous (IV) line to help "reset" the heart rhythm. °¨ In electrical cardioversion, the caregiver shocks your heart to stop its beat for a split second. This helps to reset the heart to a normal rhythm. °· Ablation. This procedure is done under mild sedation. High frequency radio wave energy is used to   destroy the area of heart tissue responsible for the SVT. °HOME CARE INSTRUCTIONS  °· Do not smoke. °· Only take medicines prescribed by your caregiver. Check with your caregiver before using over-the-counter  medicines. °· Check with your caregiver about how much alcohol and caffeine (coffee, tea, colas, or chocolate) you may have. °· It is very important to keep all follow-up referrals and appointments in order to properly manage this problem. °SEEK IMMEDIATE MEDICAL CARE IF: °· You have dizziness. °· You faint or nearly faint. °· You have shortness of breath. °· You have chest pain or pressure. °· You have sudden nausea or vomiting. °· You have profuse sweating. °· You are concerned about how long your symptoms last. °· You are concerned about the frequency of your SVT episodes. °If you have the above symptoms, call your local emergency services (911 in [place] immediately. Do not drive yourself to the hospital. °MAKE SURE YOU:  °· Understand these instructions. °· Will watch your condition. °· Will get help right away if you are not doing well or get worse. °Document Released: 04/26/2005 Document Revised: 07/19/2011 Document Reviewed: 08/08/2008 °Exit[employer]® Patient Information ©2015 [employer]. This information is not intended to replace advice given to you by your health care provider. Make sure you discuss any questions you have with your health care provider. ° °

## 2014-09-06 NOTE — Telephone Encounter (Signed)
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 Aborted with vagal maneuvers. She has not been taking evening dose of metoprolol 12.5 mg for the past four nights. Scheduled for ablation next Friday. Encouraged to take metoprolol 12.5 mg q evening up until two nights prior to surgery. She is in agreement with this plan.

## 2014-09-06 NOTE — Telephone Encounter (Signed)
Patient called requesting to speak directly with you. States that she had another episode of SVT's and went to ER at [facility]. Her charge Nurse wanted her to follow up with you about taking call this weekend.  Please call her cell # [phone]

## 2014-09-06 NOTE — Telephone Encounter (Signed)
Patient stopped by office and I reiterated that she needs to take BB as directed and she is looking forward to ablation

## 2014-09-06 NOTE — ED Provider Notes (Signed)
CSN: [id number]     Arrival date & time 09/06/14  0932 History   First MD Initiated Contact with Patient 09/06/14 [phone]     Chief Complaint  Patient presents with  . Tachycardia     (Consider location/radiation/quality/duration/timing/severity/associated sxs/prior Treatment) HPI Comments: Patient presents to the ER for evaluation of an episode of SVT. Patient has previous diagnosis of SVT, is scheduled for ablation procedure later this week. Patient was at work, here in the [place], when she had sudden onset of feeling weak, dizzy and her heart racing. Heart rate was around 200, according to OR staff. Patient was able to break the SVT in approximately 1 minute with vagal maneuvers. At arrival to the emergency department, patient is feeling much improved.   Past Medical History  Diagnosis Date  . Palpitations   . PSVT (paroxysmal supraventricular tachycardia)     Recently documented   History reviewed. No pertinent past surgical history. Family History  Problem Relation Age of Onset  . Heart attack Father     Cause of death age 68  . Heart attack Mother     Age 95   History  Substance Use Topics  . Smoking status: Never Smoker   . Smokeless tobacco: Not on file  . Alcohol Use: No   OB History    No data available     Review of Systems  Constitutional: Positive for fatigue.  Cardiovascular: Positive for palpitations.  Neurological: Positive for dizziness.  All other systems reviewed and are negative.     Allergies  Review of patient's allergies indicates no known allergies.  Home Medications   Prior to Admission medications   Medication Sig Start Date End Date Taking? Authorizing Provider  metoprolol tartrate (LOPRESSOR) 25 MG tablet Take 0.5 tablets (12.5 mg total) by mouth at bedtime. 08/21/14   Herminio Commons, MD  naproxen sodium (ANAPROX) 220 MG tablet Take 220 mg by mouth 2 (two) times daily as needed (headache).     Historical Provider, MD   BP 109/79 mmHg   Pulse 94  Resp 15  Ht 5\' 5"  (1.651 m)  Wt 150 lb (68.04 kg)  BMI 24.96 kg/m2  SpO2 100%  LMP 08/28/2014 Physical Exam  Constitutional: She is oriented to person, place, and time. She appears well-developed and well-nourished. No distress.  HENT:  Head: Normocephalic and atraumatic.  Right Ear: Hearing normal.  Left Ear: Hearing normal.  Nose: Nose normal.  Mouth/Throat: Oropharynx is clear and moist and mucous membranes are normal.  Eyes: Conjunctivae and EOM are normal. Pupils are equal, round, and reactive to light.  Neck: Normal range of motion. Neck supple.  Cardiovascular: Regular rhythm, S1 normal and S2 normal.  Exam reveals no gallop and no friction rub.   No murmur heard. Pulmonary/Chest: Effort normal and breath sounds normal. No respiratory distress. She exhibits no tenderness.  Abdominal: Soft. Normal appearance and bowel sounds are normal. There is no hepatosplenomegaly. There is no tenderness. There is no rebound, no guarding, no tenderness at McBurney's point and negative Murphy's sign. No hernia.  Musculoskeletal: Normal range of motion.  Neurological: She is alert and oriented to person, place, and time. She has normal strength. No cranial nerve deficit or sensory deficit. Coordination normal. GCS eye subscore is 4. GCS verbal subscore is 5. GCS motor subscore is 6.  Skin: Skin is warm, dry and intact. No rash noted. No cyanosis.  Psychiatric: She has a normal mood and affect. Her speech is normal and behavior is  normal. Thought content normal.  Nursing note and vitals reviewed.   ED Course  Procedures (including critical care time) Labs Review Labs Reviewed - No data to display  Imaging Review No results found.   EKG Interpretation   Date/Time:  Friday September 06 2014 07:33:59 EDT Ventricular Rate:  91 PR Interval:  132 QRS Duration: 84 QT Interval:  365 QTC Calculation: 449 R Axis:   92 Text Interpretation:  Sinus rhythm Borderline right axis deviation  Low  voltage, precordial leads Otherwise within normal limits Confirmed by  POLLINA  MD, CHRISTOPHER [phone]) on 09/06/2014 7:43:54 AM      MDM   Final diagnoses:  Paroxysmal SVT (supraventricular tachycardia)    Patient presented to the ER for evaluation after a 1 minute episode of SVT while at work here in the hospital. Patient has previous history of PSVT, scheduled for ablation later this week. Patient has been off of her Lopressor for the last several days because of side effects. She was given by mouth Lopressor here in the ER and monitored. No recurrence of symptoms. Will be discharge, follow-up with Dr. Lovena Le for ablation as scheduled.    Orpah Greek, MD 09/06/14 [phone]

## 2014-09-06 NOTE — ED Notes (Signed)
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  Had run of SVT rate 200's for about one minute while in OR.  Had SOB when HR above 200.  C/o dizziness and weakness.

## 2014-09-13 ENCOUNTER — Encounter (HOSPITAL_COMMUNITY): Payer: Self-pay | Admitting: Cardiology

## 2014-09-13 ENCOUNTER — Encounter (HOSPITAL_COMMUNITY): Admission: RE | Payer: Self-pay | Source: Ambulatory Visit

## 2014-09-13 ENCOUNTER — Ambulatory Visit (HOSPITAL_COMMUNITY)
Admission: RE | Admit: 2014-09-13 | Discharge: 2014-09-13 | Disposition: A | Discharge status: Home / Self Care | Payer: 59 | Source: Ambulatory Visit | Attending: Internal Medicine | Admitting: Internal Medicine

## 2014-09-13 ENCOUNTER — Encounter (HOSPITAL_COMMUNITY)
Admission: RE | Disposition: A | Discharge status: Home / Self Care | Payer: 59 | Source: Ambulatory Visit | Attending: Internal Medicine

## 2014-09-13 ENCOUNTER — Ambulatory Visit (HOSPITAL_COMMUNITY): Admission: RE | Admit: 2014-09-13 | Payer: 59 | Source: Ambulatory Visit | Admitting: Internal Medicine

## 2014-09-13 DIAGNOSIS — I471 Supraventricular tachycardia: Secondary | ICD-10-CM | POA: Diagnosis present

## 2014-09-13 HISTORY — PX: ELECTROPHYSIOLOGIC STUDY: SHX172A

## 2014-09-13 LAB — BASIC METABOLIC PANEL
ANION GAP: 7 (ref 5–15)
BUN: 12 mg/dL (ref 6–20)
CHLORIDE: 110 mmol/L (ref 101–111)
CO2: 23 mmol/L (ref 22–32)
Calcium: 9 mg/dL (ref 8.9–10.3)
Creatinine, Ser: 0.85 mg/dL (ref 0.44–1.00)
GFR calc Af Amer: >60 mL/min (ref >60)
GFR calc non Af Amer: >60 mL/min (ref >60)
Glucose, Bld: 90 mg/dL (ref 70–99)
POTASSIUM: 3.7 mmol/L (ref 3.5–5.1)
Sodium: 140 mmol/L (ref 135–145)

## 2014-09-13 LAB — CBC
HCT: 37 % (ref 36.0–46.0)
Hemoglobin: 12.4 g/dL (ref 12.0–15.0)
MCH: 30.5 pg (ref 26.0–34.0)
MCHC: 33.5 g/dL (ref 30.0–36.0)
MCV: 91.1 fL (ref 78.0–100.0)
PLATELETS: 236 10*3/uL (ref 150–400)
RBC: 4.06 MIL/uL (ref 3.87–5.11)
RDW: 13.3 % (ref 11.5–15.5)
WBC: 7.3 10*3/uL (ref 4.0–10.5)

## 2014-09-13 LAB — PROTIME-INR
INR: 1.04 (ref 0.00–1.49)
Prothrombin Time: 13.7 seconds (ref 11.6–15.2)

## 2014-09-13 LAB — PREGNANCY, URINE: PREG TEST UR: NEGATIVE

## 2014-09-13 SURGERY — SUPRAVENTRICULAR TACHYCARDIA ABLATION
Anesthesia: LOCAL

## 2014-09-13 SURGERY — A-FLUTTER/A-TACH/SVT ABLATION

## 2014-09-13 MED ORDER — SODIUM CHLORIDE 0.9 % IV SOLN: 250.0000 mL | INTRAVENOUS | Status: DC | PRN

## 2014-09-13 MED ORDER — ACETAMINOPHEN 325 MG PO TABS
650.0000 mg | ORAL_TABLET | ORAL | Status: DC | PRN
  Administered 2014-09-13: 650 mg via ORAL
  Filled 2014-09-13: qty 2

## 2014-09-13 MED ORDER — SODIUM CHLORIDE 0.9 % IV SOLN: INTRAVENOUS | Status: DC

## 2014-09-13 MED ORDER — FENTANYL CITRATE (PF) 100 MCG/2ML IJ SOLN
INTRAMUSCULAR | Status: DC | PRN
  Administered 2014-09-13: 25 ug via INTRAVENOUS
  Administered 2014-09-13 (×5): 12.5 ug via INTRAVENOUS

## 2014-09-13 MED ORDER — ONDANSETRON HCL 4 MG/2ML IJ SOLN
INTRAMUSCULAR | Status: AC
  Filled 2014-09-13: qty 2

## 2014-09-13 MED ORDER — ISOPROTERENOL HCL 0.2 MG/ML IJ SOLN
INTRAMUSCULAR | Status: AC
  Filled 2014-09-13: qty 5

## 2014-09-13 MED ORDER — BUPIVACAINE HCL (PF) 0.25 % IJ SOLN
INTRAMUSCULAR | Status: AC
  Filled 2014-09-13: qty 30

## 2014-09-13 MED ORDER — ONDANSETRON HCL 4 MG/2ML IJ SOLN: 4.0000 mg | Freq: Four times a day (QID) | INTRAMUSCULAR | Status: DC | PRN

## 2014-09-13 MED ORDER — SODIUM CHLORIDE 0.9 % IV SOLN
1.0000 mg | INTRAVENOUS | Status: DC | PRN
  Administered 2014-09-13: 4 ug/min via INTRAVENOUS

## 2014-09-13 MED ORDER — SODIUM CHLORIDE 0.9 % IJ SOLN: 3.0000 mL | INTRAMUSCULAR | Status: DC | PRN

## 2014-09-13 MED ORDER — HEPARIN (PORCINE) IN NACL 2-0.9 UNIT/ML-% IJ SOLN
INTRAMUSCULAR | Status: AC
  Filled 2014-09-13: qty 500

## 2014-09-13 MED ORDER — ONDANSETRON HCL 4 MG/2ML IJ SOLN
4.0000 mg | Freq: Three times a day (TID) | INTRAMUSCULAR | Status: DC | PRN
  Administered 2014-09-13: 4 mg via INTRAVENOUS

## 2014-09-13 MED ORDER — FENTANYL CITRATE (PF) 100 MCG/2ML IJ SOLN
INTRAMUSCULAR | Status: AC
  Filled 2014-09-13: qty 2

## 2014-09-13 MED ORDER — MIDAZOLAM HCL 5 MG/5ML IJ SOLN
INTRAMUSCULAR | Status: AC
  Filled 2014-09-13: qty 5

## 2014-09-13 MED ORDER — MIDAZOLAM HCL 5 MG/5ML IJ SOLN
INTRAMUSCULAR | Status: DC | PRN
  Administered 2014-09-13 (×8): 1 mg via INTRAVENOUS

## 2014-09-13 MED ORDER — SODIUM CHLORIDE 0.9 % IJ SOLN: 3.0000 mL | Freq: Two times a day (BID) | INTRAMUSCULAR | Status: DC

## 2014-09-13 SURGICAL SUPPLY — 12 items
BAG SNAP BAND KOVER 36X36 (MISCELLANEOUS) ×2 IMPLANT
CATH CELSIUS THERM D CV 7F (ABLATOR) ×2 IMPLANT
CATH HEX JOSEPH 2-5-2 65CM 6F (CATHETERS) ×2 IMPLANT
CATH JOSEPHSON QUAD-ALLRED 6FR (CATHETERS) ×4 IMPLANT
PACK EP LATEX FREE (CUSTOM PROCEDURE TRAY) ×3
PACK EP LF (CUSTOM PROCEDURE TRAY) IMPLANT
PAD DEFIB LIFELINK (PAD) ×2 IMPLANT
SHEATH PINNACLE 6F 10CM (SHEATH) ×4 IMPLANT
SHEATH PINNACLE 7F 10CM (SHEATH) ×2 IMPLANT
SHEATH PINNACLE 8F 10CM (SHEATH) ×2 IMPLANT
SHIELD RADPAD SCOOP 12X17 (MISCELLANEOUS) ×2 IMPLANT
WIRE HI TORQ VERSACORE-J 145CM (WIRE) ×2 IMPLANT

## 2014-09-13 NOTE — CV Procedure (Addendum)
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 Comprehensive EP study.  2. Coronary sinus pacing and recording.  3. Mapping of supraventricular tachycardia.  4. Radiofrequency ablation of supraventricular tachycardia.  5. Arrhythmia induction with isuprel infused   INTRODUCTION: Tara Gates is a 49 y.o. female with a history of symptomatic recurrent short RP SVT who presents today for EP study and radiofrequency ablation. The patient has had recurrent symptomatic SVT. She has failed medical therapy with verapamil. She now presents for EP study and radiofrequency ablation of SVT.   DESCRIPTION OF PROCEDURE: Informed written consent was obtained and the patient was brought to the Electrophysiology Lab in the fasting state. The patient was adequately sedated with intravenous medication (fentanyl and versed) as outlined in the anesthesia report. The patient's right neck and groin was prepped and draped in the usual sterile fashion by the EP Lab staff. Using a percutaneous Seldinger technique, a 6 F hemostasis sheath was placed into the right internal jugular vein. A 6 F curved hexapolar catheter was advanced through the RIJ into the coronary sinus for pacing and recording. Two 6-French and one 8-French hemostasis sheaths were placed  into the right common femoral vein. Two 6-French quadripolar Josephson catheters were introduced through the right common femoral vein and advanced into the His bundle and right ventricular apex positions for recording and pacing.   Presenting Measurements: The patient presented to the Electrophysiology Lab in sinus rhythm. The PR interval was 150 msec with a QRS of 76 msec and a Qt of 390 msec. The average RR interval was 906 msec. The AH interval was 104 msec and the HV interval was 39 milliseconds.   EP study:  Ventricular pacing was performed which reveals midline  concentric decremental VA conduction with a single retrograde jump but and echo beat but no tachycardia. The VA WCL was 480 msec and a VERP 600/437msec.  Rapid atrial pacing was performed with reveals PR equal to the RR with tachycardia not induced. AV block was seen at 410 ms. AEST was performed which revealed multiple prolonged AH jumps with echo beats. Tachycardia was not induced. The AVNERP was 600/383msec.  Isuprel was infused at 1-63mcg/min with an adequate acceleration in HR response observed. PR remained >> RR. Tachycardia was induced with CS pacing at 270 ms. The tachycardia cycle length was 234msec. The surface EKG was consistent with the patient's clinical tachycardia. VA time during tachycardia measured 50msec with earliest retrograde atrial activation recorded from the His electrogram. The tachycardia terminated with atrial pacing.  This was a reproducible event. V pacing was performed during tachycardia which revealed a VAV response. PVC's placed at the time of his bundle refractoriness did not pre-excite the atria. The patient was therefore felt to have classic AV nodal reentrant tachycardia. I therefore elected to perform slow pathway modification.  Isuprel was discontinued and allowed to washout.   Ablation:  A 26F quadrapolar 84mm ablation catheter was therefore advanced through the right femoral vein and advanced into the right atrium. Mapping of Koch's triangle was performed which revealed a standard sized triangle. A single lesion was delivered at site 8 in Koch's triangle with a target temperature of 60 degrees of 50 watts for 60 seconds. Good accelerated junctional rhythm was observed with intact VA conduction. A second RF lesion was applied at site 7 in Koch's triangle. Accelerated junctional rhythm was again observed however RF was discontinued due to  a single dropped retrograde beat.   Measurements following ablation:  Following ablation, Rapid atrial pacing was again performed with  PR<RR and an AV WCL of 390 msec. AEST was performed which revealed no AH jump and no echo beats and no tachycardia observed. The AVN ERP was 500/270 msec on isuprel. V pacing was performed which revealed midline concentric decremental VA conduction with no retrograde jumps and no echo beats. The VA WCL was 480 msec and a VERP 400/236msec. There was a single retrograde jump with echo beat but no arrhythmias induced.   AEST was performed which revealed no AH jumps, echo beats, or tachycardias induced.  No arrhythmias were induced. Following ablation the AH interval was 69 msec with an HV interval of 33 msec. The procedure was therefore considered completed. All catheters were removed and the sheaths were aspirated and flushed. The sheaths were removed and hemostasis was assured. EBL<62ml. There were no early apparent complications.   CONCLUSIONS:  1. Sinus rhythm upon presentation.  2. The patient had dual AV nodal physiology with easily inducible classic AV nodal reentrant tachycardia, there were no other accessory pathways or arrhythmias induced  3. Successful radiofrequency modification of the slow AV nodal pathway with 2 RF energy applications. 4. No inducible arrhythmias following ablation.  5. No early apparent complications.   Mikle Bosworth.D.

## 2014-09-13 NOTE — Discharge Instructions (Signed)
Cath Site Care Refer to this sheet in the next few weeks. These instructions provide you with information on caring for yourself after your procedure. Your caregiver may also give you more specific instructions. Your treatment has been planned according to current medical practices, but problems sometimes occur. Call your caregiver if you have any problems or questions after your procedure. HOME CARE INSTRUCTIONS  You may shower the day after the procedure.Remove the bandage (dressing) and gently wash the site with plain soap and water.Gently pat the site dry.  Do not apply powder or lotion to the site.  Do not submerge the affected site in water for 3 to 5 days.  Inspect the site at least twice daily.  Do not flex or bend the affected leg for 24 hours.  No lifting over 5 pounds (2.3 kg) for 5 days after your procedure.  Do not drive home if you are discharged the same day of the procedure. Have someone else drive you.  You may drive 24 hours after the procedure unless otherwise instructed by your caregiver.  Do not operate machinery or power tools for 24 hours.  A responsible adult should be with you for the first 24 hours after you arrive home. What to expect:  Any bruising will usually fade within 1 to 2 weeks.  Blood that collects in the tissue (hematoma) may be painful to the touch. It should usually decrease in size and tenderness within 1 to 2 weeks. SEEK IMMEDIATE MEDICAL CARE IF:  You have unusual pain at the radial site.  You have redness, warmth, swelling, or pain at the radial site.  You have drainage (other than a small amount of blood on the dressing).  You have chills.  You have a fever or persistent symptoms for more than 72 hours.  You have a fever and your symptoms suddenly get worse.  Your arm becomes pale, cool, tingly, or numb.  You have heavy bleeding from the site. Hold pressure on the site. Document Released: 05/29/2010 Document Revised:  07/19/2011 Document Reviewed: 05/29/2010 [facility] Patient Information 2015 [place], [place]. This information is not intended to replace advice given to you by your health care provider. Make sure you discuss any questions you have with your health care provider.

## 2014-09-13 NOTE — Interval H&P Note (Signed)
History and Physical Interval Note:  09/13/2014 7:29 AM  Tara Gates  has presented today for surgery, with the diagnosis of svt  The various methods of treatment have been discussed with the patient and family. After consideration of risks, benefits and other options for treatment, the patient has consented to  Procedure(s): A-Flutter/A-Tach/Svt Ablation (N/A) as a surgical intervention .  The patient's history has been reviewed, patient examined, no change in status, stable for surgery.  I have reviewed the patient's chart and labs.  Questions were answered to the patient's satisfaction.     Mikle Bosworth.D.

## 2014-09-13 NOTE — H&P (View-Only) (Signed)
      HPI [name] is referred today for evaluation of SVT. She is a pleasant 49 yo woman with a h/o palpitations dating back approx. 4 years. She has had multiple episodes of heart racing which always appear to occur while working. The episodes start and stop suddenly and are associated with sob and near syncope. She has had a documented short RP tachycardia at over 200/min. She was place on low dose beta blocker therapy and feels tired and washed out though she has not had more SVT. Denies excess caffein or ETOH. No Known Allergies   Current Outpatient Prescriptions  Medication Sig Dispense Refill  . metoprolol tartrate (LOPRESSOR) 25 MG tablet Take 0.5 tablets (12.5 mg total) by mouth at bedtime. 45 tablet 3  . naproxen sodium (ANAPROX) 220 MG tablet Take 220 mg by mouth 2 (two) times daily as needed (headache).      No current facility-administered medications for this visit.     Past Medical History  Diagnosis Date  . Palpitations   . PSVT (paroxysmal supraventricular tachycardia)     Recently documented    ROS:   All systems reviewed and negative except as noted in the HPI.   No past surgical history on file.   Family History  Problem Relation Age of Onset  . Heart attack Father     Cause of death age 91  . Heart attack Mother     Age 49     History   Social History  . Marital Status: Married    Spouse Name: N/A  . Number of Children: N/A  . Years of Education: N/A   Occupational History  . Not on file.   Social History Main Topics  . Smoking status: Never Smoker   . Smokeless tobacco: Not on file  . Alcohol Use: No  . Drug Use: No  . Sexual Activity: Not on file   Other Topics Concern  . Not on file   Social History Narrative     BP 98/60 mmHg  Pulse 68  Ht 5\' 5"  (1.651 m)  Wt 155 lb 4 oz (70.421 kg)  BMI 25.83 kg/m2  LMP 06/30/2014  Physical Exam:  Well appearing middle aged woman, NAD HEENT: Unremarkable Neck:  No JVD, no  thyromegally Lymphatics:  No adenopathy Back:  No CVA tenderness Lungs:  Clear with no wheezes HEART:  Regular rate rhythm, no murmurs, no rubs, no clicks Abd:  soft, positive bowel sounds, no organomegally, no rebound, no guarding Ext:  2 plus pulses, no edema, no cyanosis, no clubbing Skin:  No rashes no nodules Neuro:  CN II through XII intact, motor grossly intact  EKG - reviewed demonstrating no ventricular pre-excitation.  Assess/Plan:

## 2014-09-13 NOTE — Progress Notes (Signed)
Dr Lovena Le in to evaluate pt and provide comfort measures. Pt is doing well and now may go to unit bed.

## 2014-09-13 NOTE — Progress Notes (Signed)
Pt started complaining of nausea and sweatiness. Hr 129 Bp 75/50. Iv fluids increased to 976ml/hr for a 270ml bolus  4mg  Zofran give iv for nausea. Dr Lovena Le notified. Pt hr is now 69/73 and Bp 97/59.  Dr Lovena Le updated.

## 2014-09-13 NOTE — Progress Notes (Signed)
Site area: RT IJ/RT FEM VEIN Site Prior to Removal:  Level 0 Pressure Applied For:20 Manual:   YES Patient Status During Pull: A/O  Post Pull Site:  [facility] Post Pull Instructions Given:  YES Post Pull Pulses Present: PRESENT Dressing Applied:  TEGADERM X2 Bedrest begins @ 10:45:00 Comments:

## 2014-09-13 NOTE — Discharge Summary (Signed)
Physician Discharge Summary     Cardiologist:  Lovena Le  Patient ID: Tara Gates MRN: [id number] DOB/AGE: [birth year] 48 y.o.  Admit date: 09/13/2014 Discharge date: 09/13/2014  Admission Diagnoses:  PSVT  Discharge Diagnoses:  Active Problems:   PSVT (paroxysmal supraventricular tachycardia)   SVT (supraventricular tachycardia)   Discharged Condition: stable  Hospital Course:   Tara Gates is referred for evaluation of SVT. She is a pleasant 49 yo woman with a h/o palpitations dating back approx. 4 years. She has had multiple episodes of heart racing which always appear to occur while working. The episodes start and stop suddenly and are associated with sob and near syncope. She has had a documented short RP tachycardia at over 200/min. She was place on low dose beta blocker therapy and feels tired and washed out though she has not had more SVT. Denies excess caffein or ETOH.  No Known Allergies.  She was admitted for ablation.  The procedure was completed successfully without complication(see full report below).  Continue lopressor for the time being.  The patient was seen by Dr. Lovena Le who felt she was stable for DC home.    Follow up July 5.   Consults: None  Significant Diagnostic Studies:   PREPROCEDURE DIAGNOSIS: SVT   POSTPROCEDURE DIAGNOSIS: Classic AV nodal reentrant tachycardia  PROCEDURES:  1. Comprehensive EP study.  2. Coronary sinus pacing and recording.  3. Mapping of supraventricular tachycardia.  4. Radiofrequency ablation of supraventricular tachycardia.  5. Arrhythmia induction with isuprel infused   INTRODUCTION: Tara Gates is a 49 y.o. female with a history of symptomatic recurrent short RP SVT who presents today for EP study and radiofrequency ablation. The patient has had recurrent symptomatic SVT. She has failed medical therapy with verapamil. She now presents for EP study and radiofrequency ablation of SVT.   DESCRIPTION OF PROCEDURE: Informed  written consent was obtained and the patient was brought to the Electrophysiology Lab in the fasting state. The patient was adequately sedated with intravenous medication (fentanyl and versed) as outlined in the anesthesia report. The patient's right neck and groin was prepped and draped in the usual sterile fashion by the EP Lab staff. Using a percutaneous Seldinger technique, a 6 F hemostasis sheath was placed into the right internal jugular vein. A 6 F curved hexapolar catheter was advanced through the RIJ into the coronary sinus for pacing and recording. Two 6-French and one 8-French hemostasis sheaths were placed  into the right common femoral vein. Two 6-French quadripolar Josephson catheters were introduced through the right common femoral vein and advanced into the His bundle and right ventricular apex positions for recording and pacing.  Presenting Measurements: The patient presented to the Electrophysiology Lab in sinus rhythm. The PR interval was 150 msec with a QRS of 76 msec and a Qt of 390 msec. The average RR interval was 906 msec. The AH interval was 104 msec and the HV interval was 39 milliseconds.   EP study:  Ventricular pacing was performed which reveals midline concentric decremental VA conduction with a single retrograde jump but and echo beat but no tachycardia. The VA WCL was 480 msec and a VERP 600/453msec.  Rapid atrial pacing was performed with reveals PR equal to the RR with tachycardia not induced. AV block was seen at 410 ms. AEST was performed which revealed multiple prolonged AH jumps with echo beats. Tachycardia was not induced. The AVNERP was 600/352msec.  Isuprel was infused at 1-41mcg/min with an adequate acceleration in HR  response observed. PR remained >> RR. Tachycardia was induced with CS pacing at 270 ms. The tachycardia cycle length was 224msec. The surface EKG was consistent with the patient's clinical tachycardia. VA time during tachycardia measured 65msec  with earliest retrograde atrial activation recorded from the His electrogram. The tachycardia terminated with atrial pacing.  This was a reproducible event. V pacing was performed during tachycardia which revealed a VAV response. PVC's placed at the time of his bundle refractoriness did not pre-excite the atria. The patient was therefore felt to have classic AV nodal reentrant tachycardia. I therefore elected to perform slow pathway modification.  Isuprel was discontinued and allowed to washout.   Ablation:  A 35F quadrapolar 34mm ablation catheter was therefore advanced through the right femoral vein and advanced into the right atrium. Mapping of Koch's triangle was performed which revealed a standard sized triangle. A single lesion was delivered at site 8 in Koch's triangle with a target temperature of 60 degrees of 50 watts for 60 seconds. Good accelerated junctional rhythm was observed with intact VA conduction. A second RF lesion was applied at site 7 in Koch's triangle. Accelerated junctional rhythm was again observed however RF was discontinued due to a single dropped retrograde beat.   Measurements following ablation:  Following ablation, Rapid atrial pacing was again performed with PR<RR and an AV WCL of 390 msec. AEST was performed which revealed no AH jump and no echo beats and no tachycardia observed. The AVN ERP was 500/270 msec on isuprel. V pacing was performed which revealed midline concentric decremental VA conduction with no retrograde jumps and no echo beats. The VA WCL was 480 msec and a VERP 400/232msec. There was a single retrograde jump with echo beat but no arrhythmias induced.   AEST was performed which revealed no AH jumps, echo beats, or tachycardias induced.  No arrhythmias were induced. Following ablation the AH interval was 69 msec with an HV interval of 33 msec. The procedure was therefore considered completed. All catheters were removed and the sheaths were aspirated  and flushed. The sheaths were removed and hemostasis was assured. EBL<14ml. There were no early apparent complications.   CONCLUSIONS:  1. Sinus rhythm upon presentation.  2. The patient had dual AV nodal physiology with easily inducible classic AV nodal reentrant tachycardia, there were no other accessory pathways or arrhythmias induced  3. Successful radiofrequency modification of the slow AV nodal pathway with 2 RF energy applications. 4. No inducible arrhythmias following ablation.  5. No early apparent complications.   Gregg Taylor,M.D.  Treatments: See above  Discharge Exam: Blood pressure 97/53, pulse 81, temperature 99.5 F (37.5 C), temperature source Oral, resp. rate 24, height 5\' 5"  (1.651 m), weight 149 lb 14.6 oz (68 kg), last menstrual period 08/26/2014, SpO2 97 %.   Disposition: 01-Home or Self Care     Medication List    TAKE these medications        metoprolol tartrate 25 MG tablet  Commonly known as:  LOPRESSOR  Take 0.5 tablets (12.5 mg total) by mouth at bedtime.     naproxen sodium 220 MG tablet  Commonly known as:  ANAPROX  Take 220 mg by mouth 2 (two) times daily as needed (headache).       Follow-up Information    Follow up with Cristopher Peru, MD On 11/12/2014.   Specialty:  Cardiology   Why:  10:00 AM   Contact information:   [address] [place] [address] [phone]  Greater than 30 minutes was spent completing the patient's discharge.    SignedTarri Fuller , [place]  09/13/2014, 1:26 PM

## 2014-09-16 MED FILL — Bupivacaine HCl Preservative Free (PF) Inj 0.25%: INTRAMUSCULAR | Qty: 60 | Status: AC

## 2014-09-16 MED FILL — Heparin Sodium (Porcine) 2 Unit/ML in Sodium Chloride 0.9%: INTRAMUSCULAR | Qty: 500 | Status: AC

## 2014-10-22 DIAGNOSIS — C44311 Basal cell carcinoma of skin of nose: Secondary | ICD-10-CM | POA: Insufficient documentation

## 2014-11-07 ENCOUNTER — Ambulatory Visit (INDEPENDENT_AMBULATORY_CARE_PROVIDER_SITE_OTHER): Payer: Commercial Managed Care - HMO | Admitting: Internal Medicine

## 2014-11-07 ENCOUNTER — Encounter: Payer: Self-pay | Admitting: Internal Medicine

## 2014-11-07 VITALS — BP 102/68 | HR 64 | Ht 65.0 in | Wt 153.0 lb

## 2014-11-07 DIAGNOSIS — R002 Palpitations: Secondary | ICD-10-CM | POA: Diagnosis not present

## 2014-11-07 DIAGNOSIS — I471 Supraventricular tachycardia: Secondary | ICD-10-CM | POA: Diagnosis not present

## 2014-11-07 NOTE — Patient Instructions (Signed)
Medication Instructions:  Your physician recommends that you continue on your current medications as directed. Please refer to the Current Medication list given to you today.   Labwork: None ordered   Testing/Procedures: None ordered   Follow-Up: Your physician recommends that you schedule a follow-up appointment as needed    Any Other Special Instructions Will Be Listed Below (If Applicable).   

## 2014-11-07 NOTE — Assessment & Plan Note (Signed)
Her symptoms are much improved. Will follow. I asked her to avoid caffeine.

## 2014-11-07 NOTE — Assessment & Plan Note (Signed)
She is doing well s/p catheter ablation. I have asked that she stop taking her beta blocker. I'll see her back on an as needed basis.

## 2014-11-07 NOTE — Progress Notes (Signed)
      HPI Mrs. Hessler returns today for followup of SVT. She is a pleasant 49 yo woman with a h/o documented refractory SVT who underwent catheter ablation of AVNRT several weeks ago. In the interim, she has done well with no recurrent SVT. No chest pain. She has rare palpitations. No edema. No syncope. No Known Allergies   Current Outpatient Prescriptions  Medication Sig Dispense Refill  . metoprolol tartrate (LOPRESSOR) 25 MG tablet Take 0.5 tablets (12.5 mg total) by mouth at bedtime. 45 tablet 3  . naproxen sodium (ANAPROX) 220 MG tablet Take 220 mg by mouth 2 (two) times daily as needed (headache).      No current facility-administered medications for this visit.     Past Medical History  Diagnosis Date  . Palpitations   . PSVT (paroxysmal supraventricular tachycardia)     Recently documented    ROS:   All systems reviewed and negative except as noted in the HPI.   Past Surgical History  Procedure Laterality Date  . Electrophysiologic study N/A 09/13/2014    Procedure: A-Flutter/A-Tach/Svt Ablation;  Surgeon: Evans Lance, MD;  Location: [facility];  Service: Cardiovascular;  Laterality: N/A;     Family History  Problem Relation Age of Onset  . Heart attack Mother 60  . Other Father 40    Lung Ca     History   Social History  . Marital Status: Married    Spouse Name: N/A  . Number of Children: N/A  . Years of Education: N/A   Occupational History  . Not on file.   Social History Main Topics  . Smoking status: Never Smoker   . Smokeless tobacco: Never Used  . Alcohol Use: No  . Drug Use: No  . Sexual Activity: Yes    Birth Control/ Protection: Condom   Other Topics Concern  . Not on file   Social History Narrative     BP 102/68 mmHg  Pulse 64  Ht 5\' 5"  (1.651 m)  Wt 153 lb (69.4 kg)  BMI 25.46 kg/m2  Physical Exam:  Well appearing 49 yo woman, NAD HEENT: Unremarkable Neck:  6 cm JVD, no thyromegally Back:  No CVA  tenderness Lungs:  Clear with no wheezes HEART:  Regular rate rhythm, no murmurs, no rubs, no clicks Abd:  soft, positive bowel sounds, no organomegally, no rebound, no guarding Ext:  2 plus pulses, no edema, no cyanosis, no clubbing Skin:  No rashes no nodules Neuro:  CN II through XII intact, motor grossly intact  EKG - nsr with rightward axis.  Assess/Plan:

## 2014-11-12 ENCOUNTER — Encounter: Payer: 59 | Admitting: Internal Medicine

## 2015-01-06 ENCOUNTER — Ambulatory Visit: Payer: 59 | Admitting: Cardiovascular Disease

## 2015-02-07 ENCOUNTER — Ambulatory Visit (INDEPENDENT_AMBULATORY_CARE_PROVIDER_SITE_OTHER): Payer: Commercial Managed Care - HMO | Admitting: Cardiovascular Disease

## 2015-02-07 ENCOUNTER — Encounter: Payer: Self-pay | Admitting: Cardiovascular Disease

## 2015-02-07 VITALS — BP 112/68 | HR 84 | Ht 65.0 in | Wt 158.0 lb

## 2015-02-07 DIAGNOSIS — R002 Palpitations: Secondary | ICD-10-CM | POA: Diagnosis not present

## 2015-02-07 DIAGNOSIS — R5383 Other fatigue: Secondary | ICD-10-CM | POA: Diagnosis not present

## 2015-02-07 DIAGNOSIS — I471 Supraventricular tachycardia, unspecified: Secondary | ICD-10-CM

## 2015-02-07 NOTE — Patient Instructions (Signed)
Your physician wants you to follow-up in: 4 months with Dr.koneswaran You will receive a reminder letter in the mail two months in advance. If you don't receive a letter, please call our office to schedule the follow-up appointment.     Your physician recommends that you continue on your current medications as directed. Please refer to the Current Medication list given to you today.     Labs:  TSH,Free T4, Vit B12,Vit D     Thank you for choosing [facility] !

## 2015-02-07 NOTE — Progress Notes (Signed)
Patient ID: Tara Gates, female   DOB: 1965/06/07, 49 y.o.   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  She occasionally has palpitations but they are short lived. She has recorded her heart rate while working as high as 155 bpm but more frequently it will transiently go to the 120 bpm range.   She has been feeling more fatigued over the past year. She denies diarrhea and constipation. She has had weight gain and she said her hair has become more dry and is falling out. She has not seen her gynecologist in 5 years. Her menstrual cycles are shorter duration, and she denies hot and cold flashes. She denies chest pain, shortness of breath, abdominal pain, and leg swelling. She says she does not snore much and that she sleeps well.   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 2 daughters. Mateo Flow, also my patient, works at [employer] as an Therapist, sports as well. Married.  Review of Systems: As per "subjective", otherwise negative.  No Known Allergies  Current Outpatient Prescriptions  Medication Sig Dispense Refill  . naproxen sodium (ANAPROX) 220 MG tablet Take 220 mg by mouth 2 (two) times daily as needed (headache).      No current facility-administered medications for this visit.    Past Medical History  Diagnosis Date  . Palpitations   . PSVT (paroxysmal supraventricular tachycardia)     Recently documented    Past Surgical History  Procedure Laterality Date  . Electrophysiologic study N/A 09/13/2014    Procedure: A-Flutter/A-Tach/Svt Ablation;  Surgeon: Evans Lance, MD;  Location: [facility];  Service: Cardiovascular;  Laterality: N/A;    Social History   Social History  . Marital Status: Married    Spouse Name: N/A  . Number of Children: N/A  . Years of Education: N/A   Occupational History  . Not on file.   Social History Main Topics  .  Smoking status: Never Smoker   . Smokeless tobacco: Never Used  . Alcohol Use: No  . Drug Use: No  . Sexual Activity: Yes    Birth Control/ Protection: Condom   Other Topics Concern  . Not on file   Social History Narrative     Filed Vitals:   02/07/15 1554 02/07/15 1559  BP: 112/68 112/68  Pulse:  84  Height: 5\' 5"  (1.651 m) 5\' 5"  (1.651 m)  Weight: 158 lb (71.668 kg) 158 lb (71.668 kg)  SpO2:  98%    PHYSICAL EXAM General: NAD HEENT: Normal. Neck: No JVD, no thyromegaly. Lungs: Clear to auscultation bilaterally with normal respiratory effort. CV: Nondisplaced PMI.  Regular rate and rhythm, normal S1/S2, no S3/S4, no murmur. No pretibial or periankle edema.  No carotid bruit.  Normal pedal pulses.  Abdomen: Soft, nontender, no hepatosplenomegaly, no distention.  Neurologic: Alert and oriented x 3.  Psych: Normal affect. Skin: Normal. Musculoskeletal: Normal range of motion, no gross deformities. Extremities: No clubbing or cyanosis.   ECG: Most recent ECG reviewed.      ASSESSMENT AND PLAN: 1. PSVT s/p RFA in 09/2014: Symptomatically stable overall, with palpitations which are less frequent and severe. Continue to monitor.  2. Fatigue: CBC and BMET were normal in 09/2014. Will check TSH, free T4, Vit D and B levels.  Dispo: f/u 4 months.  Kate Sable, M.D., F.A.C.C.

## 2015-02-08 LAB — VITAMIN D 25 HYDROXY (VIT D DEFICIENCY, FRACTURES): Vit D, 25-Hydroxy: 31 ng/mL (ref 30–100)

## 2015-02-08 LAB — VITAMIN B12: Vitamin B-12: 395 pg/mL (ref 211–911)

## 2015-02-08 LAB — TSH: TSH: 2.782 u[IU]/mL (ref 0.350–4.500)

## 2015-02-08 LAB — T4, FREE: FREE T4: 0.83 ng/dL (ref 0.80–1.80)

## 2015-06-03 ENCOUNTER — Telehealth: Payer: Self-pay | Admitting: Cardiovascular Disease

## 2015-06-03 NOTE — Telephone Encounter (Signed)
Patient called the office stating  had a death in the family last evening  she said her daughter listened to her and said that her heart is skipping beats.   Requesting to see if Dr. Bronson Ing will speak with her.   Please call [phone]

## 2015-06-04 NOTE — Telephone Encounter (Signed)
Attempted to call back this morning. No answer, left a voicemail.

## 2017-01-15 DIAGNOSIS — L0390 Cellulitis, unspecified: Secondary | ICD-10-CM | POA: Diagnosis not present

## 2018-03-27 ENCOUNTER — Other Ambulatory Visit (HOSPITAL_COMMUNITY)
Admission: RE | Admit: 2018-03-27 | Discharge: 2018-03-27 | Disposition: A | Discharge status: Home / Self Care | Payer: 59 | Source: Ambulatory Visit | Attending: Unknown Physician Specialty | Admitting: Unknown Physician Specialty

## 2018-03-27 DIAGNOSIS — Z01419 Encounter for gynecological examination (general) (routine) without abnormal findings: Secondary | ICD-10-CM | POA: Diagnosis not present

## 2018-03-27 DIAGNOSIS — R5382 Chronic fatigue, unspecified: Secondary | ICD-10-CM | POA: Insufficient documentation

## 2018-03-27 DIAGNOSIS — Z01411 Encounter for gynecological examination (general) (routine) with abnormal findings: Secondary | ICD-10-CM | POA: Diagnosis not present

## 2018-03-27 LAB — CBC
HCT: 39.5 % (ref 36.0–46.0)
Hemoglobin: 12.6 g/dL (ref 12.0–15.0)
MCH: 30.6 pg (ref 26.0–34.0)
MCHC: 31.9 g/dL (ref 30.0–36.0)
MCV: 95.9 fL (ref 80.0–100.0)
Platelets: 255 10*3/uL (ref 150–400)
RBC: 4.12 MIL/uL (ref 3.87–5.11)
RDW: 12.2 % (ref 11.5–15.5)
WBC: 6.2 10*3/uL (ref 4.0–10.5)
nRBC: 0 % (ref 0.0–0.2)

## 2018-03-27 LAB — COMPREHENSIVE METABOLIC PANEL
ALT: 18 U/L (ref 0–44)
ANION GAP: 7 (ref 5–15)
AST: 19 U/L (ref 15–41)
Albumin: 3.8 g/dL (ref 3.5–5.0)
Alkaline Phosphatase: 75 U/L (ref 38–126)
BUN: 10 mg/dL (ref 6–20)
CO2: 26 mmol/L (ref 22–32)
CREATININE: 0.73 mg/dL (ref 0.44–1.00)
Calcium: 8.5 mg/dL — ABNORMAL LOW (ref 8.9–10.3)
Chloride: 106 mmol/L (ref 98–111)
GFR calc Af Amer: >60 mL/min (ref >60)
GFR calc non Af Amer: >60 mL/min (ref >60)
GLUCOSE: 109 mg/dL — AB (ref 70–99)
Potassium: 3.5 mmol/L (ref 3.5–5.1)
Sodium: 139 mmol/L (ref 135–145)
Total Bilirubin: 0.7 mg/dL (ref 0.3–1.2)
Total Protein: 7.3 g/dL (ref 6.5–8.1)

## 2018-03-27 LAB — LIPID PANEL
CHOL/HDL RATIO: 2.7 ratio
CHOLESTEROL: 162 mg/dL (ref 0–200)
HDL: 60 mg/dL (ref >40)
LDL Cholesterol: 96 mg/dL (ref 0–99)
Triglycerides: 32 mg/dL (ref <150)
VLDL: 6 mg/dL (ref 0–40)

## 2018-03-27 LAB — TSH: TSH: 3.203 u[IU]/mL (ref 0.350–4.500)

## 2018-03-27 LAB — HEMOGLOBIN A1C
Hgb A1c MFr Bld: 5 % (ref 4.8–5.6)
Mean Plasma Glucose: 96.8 mg/dL

## 2018-04-02 DIAGNOSIS — Z1212 Encounter for screening for malignant neoplasm of rectum: Secondary | ICD-10-CM | POA: Diagnosis not present

## 2018-04-02 DIAGNOSIS — Z1211 Encounter for screening for malignant neoplasm of colon: Secondary | ICD-10-CM | POA: Diagnosis not present

## 2020-07-22 ENCOUNTER — Other Ambulatory Visit (HOSPITAL_COMMUNITY)
Admission: RE | Admit: 2020-07-22 | Discharge: 2020-07-22 | Disposition: A | Discharge status: Home / Self Care | Payer: 59 | Source: Ambulatory Visit | Attending: Obstetrics & Gynecology | Admitting: Obstetrics & Gynecology

## 2020-07-22 ENCOUNTER — Other Ambulatory Visit: Payer: Self-pay

## 2020-07-22 ENCOUNTER — Encounter: Payer: Self-pay | Admitting: Obstetrics & Gynecology

## 2020-07-22 ENCOUNTER — Ambulatory Visit: Payer: 59 | Admitting: Obstetrics & Gynecology

## 2020-07-22 VITALS — BP 114/68 | HR 84 | Ht 64.0 in | Wt 175.0 lb

## 2020-07-22 DIAGNOSIS — Z01419 Encounter for gynecological examination (general) (routine) without abnormal findings: Secondary | ICD-10-CM | POA: Insufficient documentation

## 2020-07-22 NOTE — Progress Notes (Signed)
   WELL-WOMAN EXAMINATION Patient name: Tara Gates MRN [id number]  Date of birth: Jan 04, 1966 Chief Complaint:   Gynecologic Exam  History of Present Illness:   Tara Gates is a 55 y.o. No obstetric history on file. Caucasian female being seen today for a routine well-woman exam.  Today she notes: no acute complaints  Menses are very irregular- sometimes spotting for a week, sometimes moderate bleeding for a few days.  Notes premenstrual symptoms-    Patient's last menstrual period was 05/24/2020 (approximate). Denies issues with her menses  Last pap 2019.  Last mammogram: Nov 2022. Last colonoscopy: 03/2018  Depression screen PHQ 2/9 07/22/2020  Decreased Interest 1  Down, Depressed, Hopeless 0  PHQ - 2 Score 1  Altered sleeping 1  Tired, decreased energy 1  Change in appetite 1  Feeling bad or failure about yourself  0  Trouble concentrating 1  Moving slowly or fidgety/restless 0  Suicidal thoughts 0  PHQ-9 Score 5      Review of Systems:   Pertinent items are noted in HPI Denies any headaches, blurred vision, fatigue, shortness of breath, chest pain, abdominal pain, bowel movements, urination, or intercourse unless otherwise stated above.  Pertinent History Reviewed:  Reviewed past medical,surgical, social and family history.  Reviewed problem list, medications and allergies. Physical Assessment:   Vitals:   07/22/20 1402  BP: 114/68  Pulse: 84  Weight: 175 lb (79.4 kg)  Height: 5\' 4"  (1.626 m)  Body mass index is 30.04 kg/m.        Physical Examination:   General appearance - well appearing, and in no distress  Mental status - alert, oriented to person, place, and time  Psych:  She has a normal mood and affect  Skin - warm and dry, normal color, no suspicious lesions noted  Chest - effort normal, all lung fields clear to auscultation bilaterally  Heart - normal rate and regular rhythm  Neck:  midline trachea, no thyromegaly or nodules  Breasts -  breasts appear normal, no suspicious masses, no skin or nipple changes or  axillary nodes  Abdomen - soft, nontender, nondistended, no masses or organomegaly  Pelvic - VULVA: normal appearing vulva with no masses, tenderness or lesions  VAGINA: normal appearing vagina with normal color and discharge, no lesions  CERVIX: normal appearing cervix without discharge or lesions, no CMT  Thin prep pap is done with HR HPV cotesting  UTERUS: uterus is felt to be normal size, shape, consistency and nontender   ADNEXA: No adnexal masses or tenderness noted.  Extremities:  No swelling or varicosities noted  Chaperone: Angel Neas     Assessment & Plan:  1) Well-Woman Exam Pap collected Mammogram and colonoscopy due in Nov  Meds: No orders of the defined types were placed in this encounter.   Follow-up: Return in about 1 year (around 07/22/2021) for Annual.   Janyth Pupa, DO Attending [place], [place] for [facility], [place]

## 2020-07-25 LAB — CYTOLOGY - PAP
Comment: NEGATIVE
Diagnosis: HIGH — AB
High risk HPV: NEGATIVE

## 2020-07-28 ENCOUNTER — Telehealth: Payer: Self-pay | Admitting: *Deleted

## 2020-07-28 NOTE — Telephone Encounter (Signed)
Patient informed HSIL suggestive of abnormal cells noted on recent pap smear and requires further management.  All questions answered.  Pt will call back to schedule colpo.

## 2020-07-30 ENCOUNTER — Other Ambulatory Visit: Payer: Self-pay

## 2020-07-30 ENCOUNTER — Encounter: Payer: Self-pay | Admitting: Physician Assistant

## 2020-07-30 ENCOUNTER — Ambulatory Visit: Payer: 59 | Admitting: Physician Assistant

## 2020-07-30 DIAGNOSIS — D485 Neoplasm of uncertain behavior of skin: Secondary | ICD-10-CM

## 2020-07-30 DIAGNOSIS — D2239 Melanocytic nevi of other parts of face: Secondary | ICD-10-CM | POA: Diagnosis not present

## 2020-07-30 DIAGNOSIS — L918 Other hypertrophic disorders of the skin: Secondary | ICD-10-CM | POA: Diagnosis not present

## 2020-07-30 DIAGNOSIS — Z1283 Encounter for screening for malignant neoplasm of skin: Secondary | ICD-10-CM | POA: Diagnosis not present

## 2020-07-30 DIAGNOSIS — L820 Inflamed seborrheic keratosis: Secondary | ICD-10-CM

## 2020-07-30 DIAGNOSIS — L719 Rosacea, unspecified: Secondary | ICD-10-CM | POA: Diagnosis not present

## 2020-07-30 DIAGNOSIS — L821 Other seborrheic keratosis: Secondary | ICD-10-CM | POA: Diagnosis not present

## 2020-07-30 MED ORDER — ALCLOMETASONE DIPROPIONATE 0.05 % EX CREA
TOPICAL_CREAM | Freq: Every evening | CUTANEOUS | 11 refills | Status: DC
Start: 2020-07-30 — End: 2022-11-16

## 2020-07-30 NOTE — Patient Instructions (Signed)

## 2020-07-30 NOTE — Progress Notes (Addendum)
   New Patient   Subjective  Tara Gates is a 55 y.o. female who presents for the following: Annual Exam (Under right breast- gets caught on bra).Red face x years.    The following portions of the chart were reviewed this encounter and updated as appropriate:  Tobacco  Allergies  Meds  Problems  Med Hx  Surg Hx  Fam Hx      Objective  Well appearing patient in no apparent distress; mood and affect are within normal limits.  A full examination was performed including scalp, head, eyes, ears, nose, lips, neck, chest, axillae, abdomen, back, buttocks, bilateral upper extremities, bilateral lower extremities, hands, feet, fingers, toes, fingernails, and toenails. All findings within normal limits unless otherwise noted below.  Objective  Head to Toe: No atypical nevi or signs of NMSC noted at the time of the visit.   Objective  Left Concha: Stuck-on, waxy papules and plaques.   Objective  Left Medial Thigh, Right Postauricular Area: Erythematous stuck-on, waxy papule or plaque.   Objective  Right Inframammary Fold: Fleshy, skin-colored sessile and pedunculated papules.    Objective  Left Submandibular Area: Skin toned papule     Objective  Head - Anterior (Face): Centrifacial erythema without papules/pustules.   Assessment & Plan  Skin exam for malignant neoplasm Head to Toe  Yearly skin check  Seborrheic keratosis Left Concha  Benign okay to leave if stable  Inflamed seborrheic keratosis (2) Left Medial Thigh; Right Postauricular Area  Destruction of lesion - Left Medial Thigh, Right Postauricular Area Complexity: simple   Destruction method: electrodesiccation and curettage   Informed consent: discussed and consent obtained   Timeout:  patient name, date of birth, surgical site, and procedure verified Anesthesia: the lesion was anesthetized in a standard fashion   Anesthetic:  1% lidocaine w/ epinephrine 1-100,000 local infiltration Curettage  performed in three different directions: Yes   Curettage cycles:  3 Margin per side (cm):  0.1 Hemostasis achieved with:  aluminum chloride Outcome: patient tolerated procedure well with no complications   Post-procedure details: wound care instructions given    Skin tag Right Inframammary Fold  Benign okay to leave.  Neoplasm of uncertain behavior of skin Left Submandibular Area  Skin / nail biopsy Type of biopsy: tangential   Informed consent: discussed and consent obtained   Timeout: patient name, date of birth, surgical site, and procedure verified   Procedure prep:  Patient was prepped and draped in usual sterile fashion (Non sterile) Prep type:  Chlorhexidine Anesthesia: the lesion was anesthetized in a standard fashion   Anesthetic:  1% lidocaine w/ epinephrine 1-100,000 local infiltration Instrument used: flexible razor blade   Hemostasis achieved with: ferric subsulfate   Outcome: patient tolerated procedure well   Post-procedure details: sterile dressing applied and wound care instructions given   Dressing type: bandage and petrolatum    Specimen 1 - Surgical pathology Differential Diagnosis: R/O CN  Check Margins: No  Rosacea Head - Anterior (Face)  alclomethasone (ACLOVATE) 0.05 % cream - Head - Anterior (Face)    I, Jason Hauge, PA-C, have reviewed all documentation for this visit. The documentation on 09/22/20 for the exam, diagnosis, procedures, and orders are all accurate and complete.

## 2020-08-15 NOTE — Progress Notes (Signed)
   COLPOSCOPY PROCEDURE NOTE Patient name: Tara Gates MRN [id number]  Date of birth: [birth year] Subjective Findings:   Tara Gates is a 55 y.o. G20P2012 Caucasian female being seen today for a colposcopy. Indication: Abnormal pap on 07/22/20: HSIL w/ HRHPV negative   Today she notes that she is actually on her menses, typically irregular every several months- she has not gone a year without a period.  She blames this latest menses to stress   The risks and benefits were explained and informed consent was obtained, and written copy is in chart. Pertinent History Reviewed:   Reviewed past medical,surgical, social, obstetrical and family history.  Reviewed problem list, medications and allergies. Objective Findings & Procedure:   Vitals:   08/18/20 1210  BP: 119/70  Pulse: 85  Height: 5\' 5"  (1.651 m)  Body mass index is 29.12 kg/m.  No results found for this or any previous visit (from the past 24 hour(s)).   Time out was performed.  Speculum placed in the vagina, cervix fully visualized. SCJ: fully visualized. Cervix swabbed x 3 with acetic acid.  Acetowhitening present: minimal Cervix: no visible lesions, no mosaicism, no punctation and no abnormal vasculature. ECC obtained, cervical biopsies taken at 4, 7, 10 and 2 o'clock. 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  Will likely plan for close observation though plans may change pending results -all questions and concerns were addressed  No follow-ups on file.  Janyth Pupa, DO Attending [place], [facility] for [employer], [place]

## 2020-08-18 ENCOUNTER — Other Ambulatory Visit: Payer: Self-pay

## 2020-08-18 ENCOUNTER — Encounter: Payer: Self-pay | Admitting: Obstetrics & Gynecology

## 2020-08-18 ENCOUNTER — Ambulatory Visit (INDEPENDENT_AMBULATORY_CARE_PROVIDER_SITE_OTHER): Payer: 59 | Admitting: Obstetrics & Gynecology

## 2020-08-18 VITALS — BP 119/70 | HR 85 | Ht 65.0 in

## 2020-08-18 DIAGNOSIS — R87613 High grade squamous intraepithelial lesion on cytologic smear of cervix (HGSIL): Secondary | ICD-10-CM | POA: Diagnosis not present

## 2020-08-28 ENCOUNTER — Telehealth: Payer: Self-pay | Admitting: Obstetrics & Gynecology

## 2020-08-28 NOTE — Telephone Encounter (Signed)
Patient has questions regarding her colpo results.

## 2020-08-28 NOTE — Telephone Encounter (Signed)
Patient wants to discuss lab/test results. Clinical staff will follow up with patient.

## 2020-09-01 ENCOUNTER — Telehealth: Payer: Self-pay | Admitting: *Deleted

## 2020-09-01 NOTE — Telephone Encounter (Signed)
Patient states she did receive the message regarding repeating pap in 1 year however, she is concerned about the results from the 10 o'clock site and wants to just make sure this was not something to be concerned about. Please advise.

## 2020-09-02 ENCOUNTER — Telehealth: Payer: Self-pay | Admitting: *Deleted

## 2020-09-02 NOTE — Telephone Encounter (Signed)
Wants a call back from [place]. Patient didn't clarify the reason.

## 2020-09-02 NOTE — Telephone Encounter (Signed)
Pt called to check status of message, please see phone message from 09/01/2020

## 2020-09-30 ENCOUNTER — Other Ambulatory Visit: Payer: Self-pay | Admitting: Obstetrics & Gynecology

## 2020-09-30 DIAGNOSIS — Z1231 Encounter for screening mammogram for malignant neoplasm of breast: Secondary | ICD-10-CM

## 2020-09-30 NOTE — Progress Notes (Signed)
Called pt desires screening mammogram at [facility]- order placed

## 2020-11-11 ENCOUNTER — Other Ambulatory Visit (HOSPITAL_COMMUNITY): Payer: Self-pay | Admitting: Obstetrics & Gynecology

## 2020-11-11 DIAGNOSIS — Z1231 Encounter for screening mammogram for malignant neoplasm of breast: Secondary | ICD-10-CM

## 2020-11-13 ENCOUNTER — Ambulatory Visit (HOSPITAL_COMMUNITY)
Admission: RE | Admit: 2020-11-13 | Discharge: 2020-11-13 | Disposition: A | Discharge status: Home / Self Care | Payer: 59 | Source: Ambulatory Visit | Attending: Obstetrics & Gynecology | Admitting: Obstetrics & Gynecology

## 2020-11-13 ENCOUNTER — Encounter (HOSPITAL_COMMUNITY): Payer: Self-pay

## 2020-11-13 DIAGNOSIS — Z1231 Encounter for screening mammogram for malignant neoplasm of breast: Secondary | ICD-10-CM | POA: Diagnosis present

## 2020-12-08 ENCOUNTER — Telehealth: Payer: Self-pay | Admitting: Obstetrics & Gynecology

## 2020-12-08 NOTE — Telephone Encounter (Signed)
Patient called stating that she would like for Dr.Ozan to call her in [place] a medication she heard about. Patient uses [facility] on [address] as her pharmacy. Please contact pt

## 2020-12-09 NOTE — Telephone Encounter (Signed)
Called patient back- Tara Gates is a medication used for obesity/weight loss.  It is not a standard medicine that I prescribe.  Advised pt to establish care with PCP for further information

## 2020-12-29 ENCOUNTER — Ambulatory Visit: Payer: 59 | Admitting: Nurse Practitioner

## 2020-12-29 ENCOUNTER — Encounter: Payer: Self-pay | Admitting: Nurse Practitioner

## 2020-12-29 ENCOUNTER — Other Ambulatory Visit: Payer: Self-pay

## 2020-12-29 VITALS — BP 121/82 | HR 91 | Temp 98.3°F | Ht 64.0 in | Wt 176.0 lb

## 2020-12-29 DIAGNOSIS — Z139 Encounter for screening, unspecified: Secondary | ICD-10-CM

## 2020-12-29 DIAGNOSIS — I471 Supraventricular tachycardia: Secondary | ICD-10-CM | POA: Diagnosis not present

## 2020-12-29 DIAGNOSIS — Z7689 Persons encountering health services in other specified circumstances: Secondary | ICD-10-CM | POA: Diagnosis not present

## 2020-12-29 DIAGNOSIS — E669 Obesity, unspecified: Secondary | ICD-10-CM

## 2020-12-29 MED ORDER — RYBELSUS 3 MG PO TABS: 3.0000 mg | ORAL_TABLET | Freq: Every day | ORAL | 0 refills | Status: DC

## 2020-12-29 MED ORDER — RYBELSUS 7 MG PO TABS: 7.0000 mg | ORAL_TABLET | Freq: Every day | ORAL | 0 refills | Status: DC

## 2020-12-29 NOTE — Patient Instructions (Signed)
Please have fasting labs drawn 2-3 days prior to your appointment so we can discuss the results during your office visit.  

## 2020-12-29 NOTE — Progress Notes (Signed)
 New Patient Office Visit  Subjective:  Patient ID: Tara Gates, female    DOB: 08/31/1965  Age: 54 y.o. MRN: [id number]  CC:  Chief Complaint  Patient presents with   New Patient (Initial Visit)    Here to establish care, complains of weight gain. Wants to discuss options.     HPI Tara Gates presents for new patient visit. No recent PCP. No recent physical or labs with PCP.  She had SVT, and she had ablation. No issues since her ablation in 2016.  She would like to lose weight.  She states that she has been gaining weight in her breasts and belly and she has knee pain from gaining weight.  Past Medical History:  Diagnosis Date   Palpitations    PSVT (paroxysmal supraventricular tachycardia) (HCC)    Recently documented    Past Surgical History:  Procedure Laterality Date   ELECTROPHYSIOLOGIC STUDY N/A 09/13/2014   Procedure: A-Flutter/A-Tach/Svt Ablation;  Surgeon: Gregg W Taylor, MD;  Location: [facility];  Service: Cardiovascular;  Laterality: N/A;    Family History  Problem Relation Age of Onset   Heart attack Mother 61   Cancer Father    Lung cancer Father    Breast cancer Maternal Grandmother     Social History   Socioeconomic History   Marital status: Married    Spouse name: Not on file   Number of children: 2   Years of education: Not on file   Highest education level: Not on file  Occupational History   Occupation: [employer]  Tobacco Use   Smoking status: Never   Smokeless tobacco: Never  Vaping Use   Vaping Use: Never used  Substance and Sexual Activity   Alcohol use: No   Drug use: No   Sexual activity: Yes    Birth control/protection: None  Other Topics Concern   Not on file  Social History Narrative   Not on file   Social Determinants of Health   Financial Resource Strain: Low Risk    Difficulty of Paying Living Expenses: Not very hard  Food Insecurity: No Food Insecurity   Worried About  Running Out of Food in the Last Year: Never true   Ran Out of Food in the Last Year: Never true  Transportation Needs: No Transportation Needs   Lack of Transportation (Medical): No   Lack of Transportation (Non-Medical): No  Physical Activity: Insufficiently Active   Days of Exercise per Week: 1 day   Minutes of Exercise per Session: 30 min  Stress: No Stress Concern Present   Feeling of Stress : Not at all  Social Connections: Socially Integrated   Frequency of Communication with Friends and Family: More than three times a week   Frequency of Social Gatherings with Friends and Family: Once a week   Attends Religious Services: More than 4 times per year   Active Member of Clubs or Organizations: Yes   Attends Club or Organization Meetings: More than 4 times per year   Marital Status: Married  Intimate Partner Violence: Not At Risk   Fear of Current or Ex-Partner: No   Emotionally Abused: No   Physically Abused: No   Sexually Abused: No    ROS Review of Systems  Constitutional:  Positive for unexpected weight change.       Weight gain in the last few months  Respiratory: Negative.    Cardiovascular: Negative.   Musculoskeletal: Negative.          Gets occasional knee pain when walking up stairs  Psychiatric/Behavioral: Negative.     Objective:   Today's Vitals: BP 121/82 (BP Location: Left Arm, Patient Position: Sitting, Cuff Size: Normal)   Pulse 91   Temp 98.3 F (36.8 C) (Oral)   Ht 5' 4" (1.626 m)   Wt 176 lb (79.8 kg)   SpO2 98%   BMI 30.21 kg/m   Physical Exam Constitutional:      Appearance: Normal appearance.  Cardiovascular:     Rate and Rhythm: Normal rate and regular rhythm.     Pulses: Normal pulses.     Heart sounds: Normal heart sounds.  Pulmonary:     Effort: Pulmonary effort is normal.     Breath sounds: Normal breath sounds.  Musculoskeletal:        General: Normal range of motion.  Neurological:     Mental Status: She is alert.  Psychiatric:         Mood and Affect: Mood normal.        Behavior: Behavior normal.        Thought Content: Thought content normal.        Judgment: Judgment normal.    Assessment & Plan:   Problem List Items Addressed This Visit       Cardiovascular and Mediastinum   SVT (supraventricular tachycardia) (HCC)     Other   Encounter to establish care - Primary    -no recent PCP      Relevant Orders   Hepatitis C Antibody   HIV antibody (with reflex)   CBC with Differential/Platelet   CMP14+EGFR   Lipid Panel With LDL/HDL Ratio   TSH   Obesity (BMI 30-39.9)    -Rx. rybelsus -if any insurance issues, will consider medical weight management -f/u in 1 month      Relevant Medications   Semaglutide (RYBELSUS) 3 MG TABS   Semaglutide (RYBELSUS) 7 MG TABS (Start on 01/28/2021)   Other Visit Diagnoses     Screening due       Relevant Orders   Hepatitis C Antibody   HIV antibody (with reflex)       Outpatient Encounter Medications as of 12/29/2020  Medication Sig   alclomethasone (ACLOVATE) 0.05 % cream Apply topically at bedtime.   naproxen sodium (ANAPROX) 220 MG tablet Take 220 mg by mouth 2 (two) times daily as needed (headache).    Semaglutide (RYBELSUS) 3 MG TABS Take 3 mg by mouth daily.   [START ON 01/28/2021] Semaglutide (RYBELSUS) 7 MG TABS Take 7 mg by mouth daily.   No facility-administered encounter medications on file as of 12/29/2020.    Follow-up: Return in about 1 month (around 01/29/2021) for Physical Exam.   JOSEPH M GRAY, NP  

## 2020-12-29 NOTE — Assessment & Plan Note (Addendum)
-  Rx. rybelsus -if any insurance issues, will consider medical weight management -f/u in 1 month

## 2020-12-29 NOTE — Assessment & Plan Note (Signed)
-  no recent PCP

## 2020-12-30 ENCOUNTER — Encounter: Payer: Self-pay | Admitting: Nurse Practitioner

## 2020-12-30 ENCOUNTER — Other Ambulatory Visit: Payer: Self-pay | Admitting: Nurse Practitioner

## 2020-12-30 DIAGNOSIS — E669 Obesity, unspecified: Secondary | ICD-10-CM

## 2020-12-30 MED ORDER — RYBELSUS 3 MG PO TABS: 3.0000 mg | ORAL_TABLET | Freq: Every day | ORAL | 0 refills | Status: DC

## 2020-12-31 ENCOUNTER — Telehealth: Payer: Self-pay

## 2020-12-31 ENCOUNTER — Encounter: Payer: Self-pay | Admitting: Nurse Practitioner

## 2020-12-31 DIAGNOSIS — E669 Obesity, unspecified: Secondary | ICD-10-CM

## 2020-12-31 NOTE — Telephone Encounter (Signed)
Michael sent Rx to [facility].

## 2020-12-31 NOTE — Telephone Encounter (Signed)
Patient called said needs a prescription for Rybelsus for 3 mg at [place], if she had the prescription they said they could fill this.

## 2021-01-01 MED ORDER — RYBELSUS 3 MG PO TABS: 3.0000 mg | ORAL_TABLET | Freq: Every day | ORAL | 0 refills | Status: DC

## 2021-01-01 NOTE — Addendum Note (Signed)
Addended by: Laretta Bolster on: 01/01/2021 09:40 AM   Modules accepted: Orders

## 2021-01-27 LAB — CMP14+EGFR
ALT: 10 IU/L (ref 0–32)
AST: 17 IU/L (ref 0–40)
Albumin/Globulin Ratio: 1.8 (ref 1.2–2.2)
Albumin: 4.2 g/dL (ref 3.8–4.9)
Alkaline Phosphatase: 87 IU/L (ref 44–121)
BUN/Creatinine Ratio: 13 (ref 9–23)
BUN: 12 mg/dL (ref 6–24)
Bilirubin Total: 0.4 mg/dL (ref 0.0–1.2)
CO2: 22 mmol/L (ref 20–29)
Calcium: 8.8 mg/dL (ref 8.7–10.2)
Chloride: 103 mmol/L (ref 96–106)
Creatinine, Ser: 0.9 mg/dL (ref 0.57–1.00)
Globulin, Total: 2.4 g/dL (ref 1.5–4.5)
Glucose: 74 mg/dL (ref 65–99)
Potassium: 4 mmol/L (ref 3.5–5.2)
Sodium: 140 mmol/L (ref 134–144)
Total Protein: 6.6 g/dL (ref 6.0–8.5)
eGFR: 76 mL/min/{1.73_m2} (ref >59)

## 2021-01-27 LAB — CBC WITH DIFFERENTIAL/PLATELET
Basophils Absolute: 0 10*3/uL (ref 0.0–0.2)
Basos: 1 %
EOS (ABSOLUTE): 0.1 10*3/uL (ref 0.0–0.4)
Eos: 2 %
Hematocrit: 37.4 % (ref 34.0–46.6)
Hemoglobin: 12.6 g/dL (ref 11.1–15.9)
Immature Grans (Abs): 0 10*3/uL (ref 0.0–0.1)
Immature Granulocytes: 0 %
Lymphocytes Absolute: 1.8 10*3/uL (ref 0.7–3.1)
Lymphs: 40 %
MCH: 31.3 pg (ref 26.6–33.0)
MCHC: 33.7 g/dL (ref 31.5–35.7)
MCV: 93 fL (ref 79–97)
Monocytes Absolute: 0.4 10*3/uL (ref 0.1–0.9)
Monocytes: 9 %
Neutrophils Absolute: 2.1 10*3/uL (ref 1.4–7.0)
Neutrophils: 48 %
Platelets: 160 10*3/uL (ref 150–450)
RBC: 4.02 x10E6/uL (ref 3.77–5.28)
RDW: 11.6 % — ABNORMAL LOW (ref 11.7–15.4)
WBC: 4.4 10*3/uL (ref 3.4–10.8)

## 2021-01-27 LAB — LIPID PANEL WITH LDL/HDL RATIO
Cholesterol, Total: 165 mg/dL (ref 100–199)
HDL: 50 mg/dL (ref >39)
LDL Chol Calc (NIH): 103 mg/dL — ABNORMAL HIGH (ref 0–99)
LDL/HDL Ratio: 2.1 ratio (ref 0.0–3.2)
Triglycerides: 58 mg/dL (ref 0–149)
VLDL Cholesterol Cal: 12 mg/dL (ref 5–40)

## 2021-01-27 LAB — TSH: TSH: 3.61 u[IU]/mL (ref 0.450–4.500)

## 2021-01-27 LAB — HEPATITIS C ANTIBODY: Hep C Virus Ab: <0.1 s/co ratio (ref 0.0–0.9)

## 2021-01-27 LAB — HIV ANTIBODY (ROUTINE TESTING W REFLEX): HIV Screen 4th Generation wRfx: NONREACTIVE

## 2021-01-27 NOTE — Progress Notes (Signed)
Neg for HIV/HCV. Labs look great.

## 2021-01-29 ENCOUNTER — Ambulatory Visit: Payer: 59 | Admitting: Nurse Practitioner

## 2021-01-29 ENCOUNTER — Other Ambulatory Visit: Payer: Self-pay

## 2021-01-29 ENCOUNTER — Encounter: Payer: Self-pay | Admitting: Nurse Practitioner

## 2021-01-29 DIAGNOSIS — E669 Obesity, unspecified: Secondary | ICD-10-CM | POA: Diagnosis not present

## 2021-01-29 MED ORDER — RYBELSUS 7 MG PO TABS: 7.0000 mg | ORAL_TABLET | Freq: Every day | ORAL | 3 refills | Status: DC

## 2021-01-29 NOTE — Assessment & Plan Note (Addendum)
Wt Readings from Last 3 Encounters:  01/29/21 169 lb (76.7 kg)  12/29/20 176 lb (79.8 kg)  07/22/20 175 lb (79.4 kg)  -she started Rybelsus; was prescribed at her last OV, but wasn't able to get it until 01/15/21 -she lost 7 pounds in the last 2 weeks -refilled rybelsus

## 2021-01-29 NOTE — Progress Notes (Signed)
Acute Office Visit  Subjective:    Patient ID: Tara Gates, female    DOB: [birth year], 55 y.o.   MRN: [id number]  Chief Complaint  Patient presents with   Follow-up    HPI Patient is in today for lab follow-up.  At her last OV, she was started on Rybelsus.  Past Medical History:  Diagnosis Date   Palpitations    PSVT (paroxysmal supraventricular tachycardia) (HCC)    Recently documented    Past Surgical History:  Procedure Laterality Date   ELECTROPHYSIOLOGIC STUDY N/A 09/13/2014   Procedure: A-Flutter/A-Tach/Svt Ablation;  Surgeon: Evans Lance, MD;  Location: [place] [facility]VASIVE CV LAB CUPID;  Service: Cardiovascular;  Laterality: N/A;    Family History  Problem Relation Age of Onset   Heart attack Mother 53   Cancer Father    Lung cancer Father    Breast cancer Maternal Grandmother     Social History   Socioeconomic History   Marital status: Married    Spouse name: Not on file   Number of children: 2   Years of education: Not on file   Highest education level: Not on file  Occupational History   Occupation: [employer]  Tobacco Use   Smoking status: Never   Smokeless tobacco: Never  Vaping Use   Vaping Use: Never used  Substance and Sexual Activity   Alcohol use: No   Drug use: No   Sexual activity: Yes    Birth control/protection: None  Other Topics Concern   Not on file  Social History Narrative   Not on file   Social Determinants of Health   Financial Resource Strain: Low Risk    Difficulty of Paying Living Expenses: Not very hard  Food Insecurity: No Food Insecurity   Worried About Running Out of Food in the Last Year: Never true   [place] in the Last Year: Never true  Transportation Needs: No Transportation Needs   Lack of Transportation (Medical): No   Lack of Transportation (Non-Medical): No  Physical Activity: Insufficiently Active   Days of Exercise per Week: 1 day   Minutes of Exercise per Session: 30 min   Stress: No Stress Concern Present   Feeling of Stress : Not at all  Social Connections: Socially Integrated   Frequency of Communication with Friends and Family: More than three times a week   Frequency of Social Gatherings with Friends and Family: Once a week   Attends Religious Services: More than 4 times per year   Active Member of [employer] or Organizations: Yes   Attends Music therapist: More than 4 times per year   Marital Status: Married  Human resources officer Violence: Not At Risk   Fear of Current or Ex-Partner: No   Emotionally Abused: No   Physically Abused: No   Sexually Abused: No    Outpatient Medications Prior to Visit  Medication Sig Dispense Refill   alclomethasone (ACLOVATE) 0.05 % cream Apply topically at bedtime. 60 g 11   naproxen sodium (ANAPROX) 220 MG tablet Take 220 mg by mouth 2 (two) times daily as needed (headache).      Semaglutide (RYBELSUS) 3 MG TABS Take 3 mg by mouth daily. 30 tablet 0   Semaglutide (RYBELSUS) 7 MG TABS Take 7 mg by mouth daily. 30 tablet 0   No facility-administered medications prior to visit.    No Known Allergies  Review of Systems     Objective:    Physical Exam  BP 109/71 (BP Location: Left Arm, Patient Position: Sitting, Cuff Size: Large)   Pulse 98   Temp 98.2 F (36.8 C) (Oral)   Ht '5\' 4"'  (1.626 m)   Wt 169 lb (76.7 kg)   SpO2 95%   BMI 29.01 kg/m  Wt Readings from Last 3 Encounters:  01/29/21 169 lb (76.7 kg)  12/29/20 176 lb (79.8 kg)  07/22/20 175 lb (79.4 kg)    Health Maintenance Due  Topic Date Due   COLONOSCOPY (Pts 45-41yr Insurance coverage will need to be confirmed)  Never done    There are no preventive care reminders to display for this patient.   Lab Results  Component Value Date   TSH 3.610 01/26/2021   Lab Results  Component Value Date   WBC 4.4 01/26/2021   HGB 12.6 01/26/2021   HCT 37.4 01/26/2021   MCV 93 01/26/2021   PLT 160 01/26/2021   Lab Results  Component  Value Date   NA 140 01/26/2021   K 4.0 01/26/2021   CO2 22 01/26/2021   GLUCOSE 74 01/26/2021   BUN 12 01/26/2021   CREATININE 0.90 01/26/2021   BILITOT 0.4 01/26/2021   ALKPHOS 87 01/26/2021   AST 17 01/26/2021   ALT 10 01/26/2021   PROT 6.6 01/26/2021   ALBUMIN 4.2 01/26/2021   CALCIUM 8.8 01/26/2021   ANIONGAP 7 03/27/2018   EGFR 76 01/26/2021   Lab Results  Component Value Date   CHOL 165 01/26/2021   Lab Results  Component Value Date   HDL 50 01/26/2021   Lab Results  Component Value Date   LDLCALC 103 (H) 01/26/2021   Lab Results  Component Value Date   TRIG 58 01/26/2021   Lab Results  Component Value Date   CHOLHDL 2.7 03/27/2018   Lab Results  Component Value Date   HGBA1C 5.0 03/27/2018       Assessment & Plan:   Problem List Items Addressed This Visit       Other   Obesity (BMI 30-39.9)    Wt Readings from Last 3 Encounters:  01/29/21 169 lb (76.7 kg)  12/29/20 176 lb (79.8 kg)  07/22/20 175 lb (79.4 kg)  -she started Rybelsus; was prescribed at her last OV, but wasn't able to get it until 01/15/21 -she lost 7 pounds in the last 2 weeks -refilled rybelsus       Relevant Medications   Semaglutide (RYBELSUS) 7 MG TABS     Meds ordered this encounter  Medications   Semaglutide (RYBELSUS) 7 MG TABS    Sig: Take 7 mg by mouth daily.    Dispense:  30 tablet    Refill:  [place] NP

## 2021-04-30 ENCOUNTER — Ambulatory Visit: Payer: 59 | Admitting: Nurse Practitioner

## 2021-04-30 ENCOUNTER — Encounter: Payer: Self-pay | Admitting: Nurse Practitioner

## 2021-04-30 ENCOUNTER — Other Ambulatory Visit: Payer: Self-pay

## 2021-04-30 VITALS — BP 113/76 | HR 82 | Ht 64.0 in | Wt 155.0 lb

## 2021-04-30 DIAGNOSIS — E669 Obesity, unspecified: Secondary | ICD-10-CM

## 2021-04-30 MED ORDER — RYBELSUS 7 MG PO TABS: 7.0000 mg | ORAL_TABLET | Freq: Every day | ORAL | 3 refills | Status: DC

## 2021-04-30 NOTE — Patient Instructions (Addendum)
Please have fasting labs drawn 2-3 days prior to your appointment so we can discuss the results during your office visit.  I will be moving to [facility] [place] located at [address] [place], [place] [address] effective May 10, 2021. If you would like to establish care with [facility] please call [phone].

## 2021-04-30 NOTE — Progress Notes (Signed)
Acute Office Visit  Subjective:    Patient ID: Tara Gates, female    DOB: 1965/12/13, 55 y.o.   MRN: [id number]  Chief Complaint  Patient presents with   Follow-up    HPI Patient is in today for med check. At a previous OV, she was started on Rybelsus to help with weight loss.  Past Medical History:  Diagnosis Date   Palpitations    PSVT (paroxysmal supraventricular tachycardia) (HCC)    Recently documented    Past Surgical History:  Procedure Laterality Date   ELECTROPHYSIOLOGIC STUDY N/A 09/13/2014   Procedure: A-Flutter/A-Tach/Svt Ablation;  Surgeon: Evans Lance, MD;  Location: [place] INVASIVE CV LAB CUPID;  Service: Cardiovascular;  Laterality: N/A;    Family History  Problem Relation Age of Onset   Heart attack Mother 99   Cancer Father    Lung cancer Father    Breast cancer Maternal Grandmother     Social History   Socioeconomic History   Marital status: Married    Spouse name: Not on file   Number of children: 2   Years of education: Not on file   Highest education level: Not on file  Occupational History   Occupation: [employer]  Tobacco Use   Smoking status: Never   Smokeless tobacco: Never  Vaping Use   Vaping Use: Never used  Substance and Sexual Activity   Alcohol use: No   Drug use: No   Sexual activity: Yes    Birth control/protection: None  Other Topics Concern   Not on file  Social History Narrative   Not on file   Social Determinants of Health   Financial Resource Strain: Low Risk    Difficulty of Paying Living Expenses: Not very hard  Food Insecurity: No Food Insecurity   Worried About Running Out of Food in the Last Year: Never true   [place] in the Last Year: Never true  Transportation Needs: No Transportation Needs   Lack of Transportation (Medical): No   Lack of Transportation (Non-Medical): No  Physical Activity: Insufficiently Active   Days of Exercise per Week: 1 day   Minutes of Exercise  per Session: 30 min  Stress: No Stress Concern Present   Feeling of Stress : Not at all  Social Connections: Socially Integrated   Frequency of Communication with Friends and Family: More than three times a week   Frequency of Social Gatherings with Friends and Family: Once a week   Attends Religious Services: More than 4 times per year   Active Member of [employer] or Organizations: Yes   Attends Music therapist: More than 4 times per year   Marital Status: Married  Human resources officer Violence: Not At Risk   Fear of Current or Ex-Partner: No   Emotionally Abused: No   Physically Abused: No   Sexually Abused: No    Outpatient Medications Prior to Visit  Medication Sig Dispense Refill   alclomethasone (ACLOVATE) 0.05 % cream Apply topically at bedtime. 60 g 11   naproxen sodium (ANAPROX) 220 MG tablet Take 220 mg by mouth 2 (two) times daily as needed (headache).      Semaglutide (RYBELSUS) 7 MG TABS Take 7 mg by mouth daily. 30 tablet 3   No facility-administered medications prior to visit.    No Known Allergies  Review of Systems  Constitutional: Negative.   Respiratory: Negative.    Cardiovascular: Negative.   Psychiatric/Behavioral: Negative.  Objective:    Physical Exam Constitutional:      Appearance: Normal appearance.  Cardiovascular:     Rate and Rhythm: Normal rate and regular rhythm.     Pulses: Normal pulses.     Heart sounds: Normal heart sounds.  Pulmonary:     Effort: Pulmonary effort is normal.     Breath sounds: Normal breath sounds.  Neurological:     Mental Status: She is alert.  Psychiatric:        Mood and Affect: Mood normal.        Behavior: Behavior normal.        Thought Content: Thought content normal.        Judgment: Judgment normal.    BP 113/76    Pulse 82    Ht _0  (1.626 m)    Wt 155 lb (70.3 kg)    SpO2 96%    BMI 26.61 kg/m  Wt Readings from Last 3 Encounters:  04/30/21 155 lb (70.3 kg)  01/29/21 169 lb (76.7  kg)  12/29/20 176 lb (79.8 kg)    Health Maintenance Due  Topic Date Due   COVID-19 Vaccine (1) Never done   Zoster Vaccines- Shingrix (1 of 2) Never done   COLONOSCOPY (Pts 45-37yr Insurance coverage will need to be confirmed)  Never done    There are no preventive care reminders to display for this patient.   Lab Results  Component Value Date   TSH 3.610 01/26/2021   Lab Results  Component Value Date   WBC 4.4 01/26/2021   HGB 12.6 01/26/2021   HCT 37.4 01/26/2021   MCV 93 01/26/2021   PLT 160 01/26/2021   Lab Results  Component Value Date   NA 140 01/26/2021   K 4.0 01/26/2021   CO2 22 01/26/2021   GLUCOSE 74 01/26/2021   BUN 12 01/26/2021   CREATININE 0.90 01/26/2021   BILITOT 0.4 01/26/2021   ALKPHOS 87 01/26/2021   AST 17 01/26/2021   ALT 10 01/26/2021   PROT 6.6 01/26/2021   ALBUMIN 4.2 01/26/2021   CALCIUM 8.8 01/26/2021   ANIONGAP 7 03/27/2018   EGFR 76 01/26/2021   Lab Results  Component Value Date   CHOL 165 01/26/2021   Lab Results  Component Value Date   HDL 50 01/26/2021   Lab Results  Component Value Date   LDLCALC 103 (H) 01/26/2021   Lab Results  Component Value Date   TRIG 58 01/26/2021   Lab Results  Component Value Date   CHOLHDL 2.7 03/27/2018   Lab Results  Component Value Date   HGBA1C 5.0 03/27/2018       Assessment & Plan:   Problem List Items Addressed This Visit       Other   Obesity (BMI 30-39.9) - Primary    Wt Readings from Last 3 Encounters:  04/30/21 155 lb (70.3 kg)  01/29/21 169 lb (76.7 kg)  12/29/20 176 lb (79.8 kg)   -she is down 21 pounds since starting this -goal weight is 140 lbs -she is taking this every other day -continue rybelsus  BMI Readings from Last 3 Encounters:  04/30/21 26.61 kg/m  01/29/21 29.01 kg/m  12/29/20 30.21 kg/m  BMI is much improved (from 30.2 down to 26.6)       Relevant Medications   Semaglutide (RYBELSUS) 7 MG TABS   Other Relevant Orders   CMP14+EGFR    CBC with Differential/Platelet   Lipid Panel With LDL/HDL Ratio  Meds ordered this encounter  Medications   Semaglutide (RYBELSUS) 7 MG TABS    Sig: Take 7 mg by mouth daily.    Dispense:  30 tablet    Refill:  [place], NP

## 2021-04-30 NOTE — Assessment & Plan Note (Addendum)
Wt Readings from Last 3 Encounters:  04/30/21 155 lb (70.3 kg)  01/29/21 169 lb (76.7 kg)  12/29/20 176 lb (79.8 kg)    -she is down 21 pounds since starting this -goal weight is 140 lbs -she is taking this every other day -continue rybelsus  BMI Readings from Last 3 Encounters:  04/30/21 26.61 kg/m  01/29/21 29.01 kg/m  12/29/20 30.21 kg/m   BMI is much improved (from 30.2 down to 26.6)

## 2021-07-21 ENCOUNTER — Other Ambulatory Visit: Payer: Self-pay

## 2021-07-21 ENCOUNTER — Encounter (INDEPENDENT_AMBULATORY_CARE_PROVIDER_SITE_OTHER): Payer: Self-pay | Admitting: OTOLARYNGOLOGY

## 2021-07-21 ENCOUNTER — Ambulatory Visit (INDEPENDENT_AMBULATORY_CARE_PROVIDER_SITE_OTHER): Payer: 59 | Admitting: OTOLARYNGOLOGY

## 2021-07-21 VITALS — Ht 66.0 in | Wt 165.0 lb

## 2021-07-21 DIAGNOSIS — M3130 Wegener's granulomatosis without renal involvement: Secondary | ICD-10-CM

## 2021-07-21 DIAGNOSIS — H6121 Impacted cerumen, right ear: Secondary | ICD-10-CM

## 2021-07-21 DIAGNOSIS — H6982 Other specified disorders of Eustachian tube, left ear: Secondary | ICD-10-CM

## 2021-07-21 DIAGNOSIS — J3489 Other specified disorders of nose and nasal sinuses: Secondary | ICD-10-CM

## 2021-07-21 DIAGNOSIS — R490 Dysphonia: Secondary | ICD-10-CM

## 2021-07-21 NOTE — Procedures (Signed)
ENT, [place]  [place] [address]    Procedure Note    Name: Martha Deleon MRN:  [id number]   Date: 07/21/2021 Age: 56 y.o.       Ear Cerumen Removal  Performed by: Dia Sitter, DO  Authorized by: Dia Sitter, DO     Consent:     Consent obtained:  Verbal    Consent given by:  Patient    Risks, benefits, and alternatives were discussed: yes      Risks discussed:  Bleeding    Alternatives discussed:  No treatment and observation  Universal protocol:     Patient identity confirmed:  Verbally with patient  Procedure details:     Location:  R ear    Procedure type: curette      Procedure outcomes: cerumen removed    Post-procedure details:     Inspection:  No bleeding and TM intact    Hearing quality:  Improved    Procedure completion:  Tolerated well, no immediate complications        Mathis Dad, PA-C

## 2021-07-21 NOTE — H&P (Signed)
ENT, [place]  [place] [address]  Phone: [phone]  Fax: [phone]      Encounter Date: 07/21/2021    Patient ID: Martha Deleon  MRN: [id number]    DOB: 20-Mar-1966  Age: 56 y.o. female        Referring Provider:    Gaynelle Adu, [place],  [place] [address]    Reason for Visit:   Chief Complaint   Patient presents with   . Allergic Rhinitis     Complains of post nasal drainage.   . Ear Problem(s)     Complains of fullness and popping in left ear along with pain at times.         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 She c/o left aural fullness and fluid sensation and sounds in the ear since Dec when she had the flu. Abx and steroids helped temporarily. States hearing seems normal. Recalls earaches as a child. Denies otorrhea, otalgia.  Her voice comes and goes. Saw a Environmental manager, Dr. Zigmund Daniel, at [facility].    Pt was dx'd with Wegener's in 2008, in remission now and not requiring steroids. Sees Rheum in [place].  Tympanogram: AD Type A    AS CNS      Patient History:  Patient Active Problem List   Diagnosis   . Basal cell carcinoma of nose   . Dysphonia   . Granulomatosis with polyangiitis (CMS HCC)     Current Outpatient Medications   Medication Sig   . albuterol sulfate (PROVENTIL OR VENTOLIN OR PROAIR) 90 mcg/actuation Inhalation oral inhaler    . carvediloL (COREG) 3.125 mg Oral Tablet Take 1 Tablet (3.125 mg total) by mouth   . cyanocobalamin (VITAMIN B12) 1,000 mcg/mL Injection Solution    . diazePAM (VALIUM) 2 mg Oral Tablet    . Ibuprofen (MOTRIN) 800 mg Oral Tablet    . montelukast (SINGULAIR) 10 mg Oral Tablet    . omeprazole (PRILOSEC) 40 mg Oral Capsule, Delayed Release(E.C.)      Allergies   Allergen Reactions   . Iodinated Contrast Media Shortness of Breath, Hives/ Urticaria and Nausea/ Vomiting     Other reaction(s): Flushing (ALLERGY/intolerance)   . Penicillins  Rash   . Sulfamethoxazole Rash     Past Medical History:   Diagnosis Date   . Asthma    . Autoimmune disease (CMS [place])    . Cancer (CMS [place])    . Esophageal reflux    . Migraine       History reviewed. No pertinent surgical history.   Family Medical History:    None         Social History     Tobacco Use   . Smoking status: Never   . Smokeless tobacco: Never       Review of Systems:  Review of Systems   Constitutional: Negative.        Physical Exam:  Ht 1.676 m ('5\' 6"'$ )   Wt 74.8 kg (165 lb)   BMI 26.63 kg/m       Physical Exam  Constitutional:       Appearance: Normal appearance. She is well-developed, well-groomed and normal weight.   HENT:      Head: Normocephalic and atraumatic.      Right Ear: Hearing, tympanic membrane, ear canal and external ear normal. There is impacted cerumen.  Left Ear: Hearing, ear canal and external ear normal. A middle ear effusion is present.      Nose: Septal deviation and mucosal edema present.      Right Turbinates: Enlarged.      Left Turbinates: Enlarged.      Comments: Mild crusting L>R     Mouth/Throat:      Lips: Pink.      Mouth: Mucous membranes are moist.      Pharynx: Oropharynx is clear. Uvula midline.   Eyes:      Extraocular Movements: Extraocular movements intact.   Neck:      Trachea: Phonation normal.   Pulmonary:      Effort: Pulmonary effort is normal.   Musculoskeletal:      Cervical back: Normal range of motion and neck supple.   Lymphadenopathy:      Cervical: No cervical adenopathy.   Skin:     General: Skin is warm.   Neurological:      Mental Status: She is alert and oriented to person, place, and time.      Cranial Nerves: Cranial nerves 2-12 are intact. No facial asymmetry.   Psychiatric:         Attention and Perception: Attention normal.         Mood and Affect: Mood normal.         Speech: Speech normal.         Behavior: Behavior normal. Behavior is cooperative.      Comments: Raspy dysphonia         Assessment:  ENCOUNTER DIAGNOSES     ICD-10-CM    1. Impacted cerumen of right ear  H61.21   2. ETD (Eustachian tube dysfunction), left  H69.82   3. 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 Nasal vestibulitis  J34.89       Plan:  Medical records reviewed on 07/21/2021.  Will order audiogram  Orders Placed This Encounter   . 01751 - NASAL ENDOSCOPY DIAGNOSTIC UNILATERAL OR BILATERAL (AMB ONLY)   . 02585 - REMOVAL IMPACTED CERUMEN W/ INSTRUMENT, UNILATERAL (AMB ONLY-PD)   . Ear Cerumen Removal   . Referral to [facility]   . POCT HEARING/VISION/TYMPANOGRAM (AMB ONLY)   Will start bactroban and budesonide irrigations.  Will check audiogram    Return for after audiogram.    Mathis Dad, PA-C  07/21/2021, 15:56     The advanced practice clinician's documentation was reviewed/amended in its entirety with the assessment and plan portion completely performed independently by me during this separate encounter.

## 2021-07-21 NOTE — Procedures (Signed)
ENT, [place]  [place] [address]    Procedure Note    Name: GLADINE PLUDE MRN:  [id number]   Date: 07/21/2021 Age: 56 y.o.       31575 - LARYNGOSCOPY, FLEXIBLE DIAGNOSTIC (AMB ONLY)  Performed by: Dia Sitter, DO  Authorized by: Dia Sitter, DO     Time Out:     Immediately before the procedure, a time out was called:  Yes    Patient verified:  Yes    Procedure Verified:  Yes    Site Verified:  Yes  Documentation:      Indications for procedure: Hoarseness    Anesthesia: Oxymetazoline nasal spray    Description: The flexible endoscope was gently introduced into the nostril and passed along the floor of the nose to the nasopharynx. Adenoid was minimal and eustachian tubes normal. The retropalatal airway was patent.    The endoscope was passed to the oropharynx. Base of tongue displayed normal lingual tonsils, patent valelulla, and sharply defined upright epiglottis. Retrolingual airway was patent.    The larynx displayed normal true vocal cords with good mobility. False cords were normal. Arytenoid mucosa was pink with no edema.     The piriform recesses were symmetric without secretion. The hypopharynx was symmetric without lesion.    Findings: Symmetrical true vocal fold motion    The patient tolerated the procedure well.            51025 - REMOVAL IMPACTED CERUMEN W/ INSTRUMENT, UNILATERAL (AMB ONLY-PD)  Performed by: Dia Sitter, DO  Authorized by: Dia Sitter, DO     Time Out:     Immediately before the procedure, a time out was called:  Yes    Patient verified:  Yes    Procedure Verified:  Yes    Site Verified:  Yes  Documentation:      Procedure: Cerumen cleaning  Pre-op Dx: Cerumen impaction      Right EAC(s) examined under binocular microscopy.  Cerumen and/or debris was cleaned from the canal(s) using curettes, suction, and alligator forceps.  Patient tolerated procedure well.          Dia Sitter, DO

## 2021-07-29 LAB — CBC WITH DIFFERENTIAL/PLATELET
Basophils Absolute: 0.1 10*3/uL (ref 0.0–0.2)
Basos: 1 %
EOS (ABSOLUTE): 0.1 10*3/uL (ref 0.0–0.4)
Eos: 2 %
Hematocrit: 38.6 % (ref 34.0–46.6)
Hemoglobin: 13 g/dL (ref 11.1–15.9)
Immature Grans (Abs): 0 10*3/uL (ref 0.0–0.1)
Immature Granulocytes: 0 %
Lymphocytes Absolute: 1.8 10*3/uL (ref 0.7–3.1)
Lymphs: 38 %
MCH: 31.4 pg (ref 26.6–33.0)
MCHC: 33.7 g/dL (ref 31.5–35.7)
MCV: 93 fL (ref 79–97)
Monocytes Absolute: 0.3 10*3/uL (ref 0.1–0.9)
Monocytes: 7 %
Neutrophils Absolute: 2.5 10*3/uL (ref 1.4–7.0)
Neutrophils: 52 %
Platelets: 208 10*3/uL (ref 150–450)
RBC: 4.14 x10E6/uL (ref 3.77–5.28)
RDW: 11.9 % (ref 11.7–15.4)
WBC: 4.8 10*3/uL (ref 3.4–10.8)

## 2021-07-29 LAB — CMP14+EGFR
ALT: 9 IU/L (ref 0–32)
AST: 14 IU/L (ref 0–40)
Albumin/Globulin Ratio: 1.8 (ref 1.2–2.2)
Albumin: 4.1 g/dL (ref 3.8–4.9)
Alkaline Phosphatase: 83 IU/L (ref 44–121)
BUN/Creatinine Ratio: 11 (ref 9–23)
BUN: 10 mg/dL (ref 6–24)
Bilirubin Total: 0.3 mg/dL (ref 0.0–1.2)
CO2: 24 mmol/L (ref 20–29)
Calcium: 8.9 mg/dL (ref 8.7–10.2)
Chloride: 107 mmol/L — ABNORMAL HIGH (ref 96–106)
Creatinine, Ser: 0.95 mg/dL (ref 0.57–1.00)
Globulin, Total: 2.3 g/dL (ref 1.5–4.5)
Glucose: 83 mg/dL (ref 70–99)
Potassium: 3.9 mmol/L (ref 3.5–5.2)
Sodium: 144 mmol/L (ref 134–144)
Total Protein: 6.4 g/dL (ref 6.0–8.5)
eGFR: 71 mL/min/{1.73_m2} (ref >59)

## 2021-07-29 LAB — LIPID PANEL WITH LDL/HDL RATIO
Cholesterol, Total: 153 mg/dL (ref 100–199)
HDL: 60 mg/dL (ref >39)
LDL Chol Calc (NIH): 83 mg/dL (ref 0–99)
LDL/HDL Ratio: 1.4 ratio (ref 0.0–3.2)
Triglycerides: 48 mg/dL (ref 0–149)
VLDL Cholesterol Cal: 10 mg/dL (ref 5–40)

## 2021-07-30 ENCOUNTER — Other Ambulatory Visit: Payer: Self-pay

## 2021-07-30 ENCOUNTER — Encounter: Payer: Self-pay | Admitting: Nurse Practitioner

## 2021-07-30 ENCOUNTER — Ambulatory Visit: Payer: 59 | Admitting: Nurse Practitioner

## 2021-07-30 DIAGNOSIS — E669 Obesity, unspecified: Secondary | ICD-10-CM | POA: Diagnosis not present

## 2021-07-30 MED ORDER — WEGOVY 0.5 MG/0.5ML ~~LOC~~ SOAJ: 0.5000 mg | SUBCUTANEOUS | 0 refills | Status: DC

## 2021-07-30 NOTE — Progress Notes (Signed)
? ?  Tara Gates     MRN: [id number]      DOB: [birth year] ? ? ?HPI ?Ms. Tara Gates with medical history of SVT, obesity is here for follow up for obesity.  ?Patient stated that she stopped taking Rybelsus  about 3 weeks ago due to nausea, she has been eating one meal a day, eats carrots for snacks. Does fast walking excercises 3-4 times a week.patient states that she has gained 4 lbs back since she stopped taking med.  Patient wants to know if she can continue on a lower dose of rybelsus or switch to wegovy because she is concerned about regaining weight if she stops taking medication. ? ?Patient states that she had Cologuard done 2 years ago, up-to-date with flu vaccine and COVID-vaccine.  ? ?ROS ?Denies recent fever or chills. ?Denies sinus pressure, nasal congestion, ear pain or sore throat. ?Denies chest congestion, productive cough or wheezing. ?Denies chest pains, palpitations and leg swelling ?Denies abdominal pain, vomiting,diarrhea or constipation.   ?Denies dysuria, frequency, hesitancy or incontinence. ?Denies joint pain, swelling and limitation in mobility. ?Denies headaches, seizures, numbness, or tingling. ?Denies depression, anxiety or insomnia. ? ? ? ?PE ? ?BP 110/75 (BP Location: Right Arm, Patient Position: Sitting, Cuff Size: Normal)   Pulse 84   Ht '5\' 4"'$  (1.626 m)   Wt 149 lb (67.6 kg)   LMP 05/18/2021 (Approximate)   SpO2 100%   BMI 25.58 kg/m?  ? ?Patient alert and oriented and in no cardiopulmonary distress. ? ?Chest: Clear to auscultation bilaterally. ? ?CVS: S1, S2 no murmurs, no S3.Regular rate. ? ?ABD: Soft non tender.  ? ?Ext: No edema ? ?MS: Adequate ROM spine, shoulders, hips and knees. ? ?Psych: Good eye contact, normal affect. Memory intact not anxious or depressed appearing. ? ? ? ?Assessment & Plan ? ?Obesity (BMI 30-39.9) ?Wt Readings from Last 3 Encounters:  ?07/30/21 149 lb (67.6 kg)  ?04/30/21 155 lb (70.3 kg)  ?01/29/21 169 lb (76.7 kg)  ?her goal is 140lb.  ?BMI 25.58 today ,  she has lost 27 pounds since starting Rybelsus ?BMI was 30.21 kg/mm? when patient started taking Rybelsus. ?Stopped taking Rybelsus '7mg'$  daily  due to nausea. ?Patient wants to prevent gaining weight, she has gained 4 lbs since she stopped med. ?Start Wegovy 0.25 mg weekly injection take once weekly for 4 weeks sample given today.  Will titrate to [facility] 0.5 mg once a week injection after 4 weeks. ?Follow-up in 3 months.  ?Patient encouraged to continue healthy diet and regular moderate exercise.    ?

## 2021-07-30 NOTE — Patient Instructions (Signed)
Please stop taking rybelsus , start taking wegovy 0.25 mg once a weekly injection for weight loss management. ? ?It is important that you exercise regularly at least 30 minutes 5 times a week.  ?Think about what you will eat, plan ahead. ?Choose " clean, green, fresh or frozen" over canned, processed or packaged foods which are more sugary, salty and fatty. ?70 to 75% of food eaten should be vegetables and fruit. ?Three meals at set times with snacks allowed between meals, but they must be fruit or vegetables. ?Aim to eat over a 12 hour period , example 7 am to 7 pm, and STOP after  your last meal of the day. ?Drink water,generally about 64 ounces per day, no other drink is as healthy. Fruit juice is best enjoyed in a healthy way, by EATING the fruit. ? ?Thanks for choosing [facility], we consider it a privelige to serve you. ? ?

## 2021-07-30 NOTE — Assessment & Plan Note (Addendum)
Wt Readings from Last 3 Encounters:  ?07/30/21 149 lb (67.6 kg)  ?04/30/21 155 lb (70.3 kg)  ?01/29/21 169 lb (76.7 kg)  ?her goal is 140lb.  ?BMI 25.58 today , she has lost 27 pounds since starting Rybelsus ?BMI was 30.21 kg/mm? when patient started taking Rybelsus. ?Stopped taking Rybelsus '7mg'$  daily  due to nausea. ?Patient wants to prevent gaining weight, she has gained 4 lbs since she stopped med. ?Start Wegovy 0.25 mg weekly injection take once weekly for 4 weeks sample given today.  Will titrate to [facility] 0.5 mg once a week injection after 4 weeks. ?Follow-up in 3 months.  ?Patient encouraged to continue healthy diet and regular moderate exercise.   ?

## 2021-08-31 ENCOUNTER — Other Ambulatory Visit: Payer: Self-pay

## 2021-08-31 ENCOUNTER — Encounter (INDEPENDENT_AMBULATORY_CARE_PROVIDER_SITE_OTHER): Payer: Self-pay | Admitting: OTOLARYNGOLOGY

## 2021-08-31 ENCOUNTER — Ambulatory Visit (INDEPENDENT_AMBULATORY_CARE_PROVIDER_SITE_OTHER): Payer: 59 | Admitting: OTOLARYNGOLOGY

## 2021-08-31 VITALS — Ht 66.0 in | Wt 165.0 lb

## 2021-08-31 DIAGNOSIS — R490 Dysphonia: Secondary | ICD-10-CM

## 2021-08-31 DIAGNOSIS — M3130 Wegener's granulomatosis without renal involvement: Secondary | ICD-10-CM

## 2021-08-31 DIAGNOSIS — J3489 Other specified disorders of nose and nasal sinuses: Secondary | ICD-10-CM

## 2021-08-31 DIAGNOSIS — H6982 Other specified disorders of Eustachian tube, left ear: Secondary | ICD-10-CM

## 2021-08-31 NOTE — H&P (Signed)
ENT, [name] [address]  Phone: [phone]  Fax: [phone]      Encounter Date: 08/31/2021    Patient ID: Martha Deleon  MRN: [id number]    DOB: Jun 11, 1965  Age: 56 y.o. female     Progress Note       Referring Provider:  No ref. provider found    Reason for Visit:   Chief Complaint   Patient presents with   . Follow-up After Testing     Rc on ears. Patient did no have audio done due to ear popping and hearing getting better.        History of Present Illness:  Martha Deleon is a 56 y.o. female who is FU on sinuses. She did not get the bactroban budesonide rinses but still using saline rinses. Sinuses feel stable Her ear popped and is feeling back to normal, so she did not have the audiogram. She has no complaints today.     Tympanogram: AD Type A      AS Type C    Patient History:  Patient Active Problem List   Diagnosis   . Basal cell carcinoma of nose   . Dysphonia   . Granulomatosis with polyangiitis (CMS HCC)     Current Outpatient Medications   Medication Sig   . albuterol sulfate (PROVENTIL OR VENTOLIN OR PROAIR) 90 mcg/actuation Inhalation oral inhaler    . carvediloL (COREG) 3.125 mg Oral Tablet Take 1 Tablet (3.125 mg total) by mouth   . cyanocobalamin (VITAMIN B12) 1,000 mcg/mL Injection Solution    . diazePAM (VALIUM) 2 mg Oral Tablet    . Ibuprofen (MOTRIN) 800 mg Oral Tablet    . montelukast (SINGULAIR) 10 mg Oral Tablet    . NORTREL 7/7/7, 28, 0.5/0.75/1 mg- 35 mcg Oral Tablet    . omeprazole (PRILOSEC) 40 mg Oral Capsule, Delayed Release(E.C.)       Allergies   Allergen Reactions   . Iodinated Contrast Media Shortness of Breath, Hives/ Urticaria and Nausea/ Vomiting     Other reaction(s): Flushing (ALLERGY/intolerance)   . Penicillins Rash   . Sulfamethoxazole Rash     Past Medical History:   Diagnosis Date   . Asthma    . Autoimmune disease (CMS [place])    . Cancer (CMS [place])    . Esophageal reflux    . Migraine      History reviewed. No pertinent surgical  history.  Family Medical History:    None         Social History     Tobacco Use   . Smoking status: Never   . Smokeless tobacco: Never       Review of Systems:  Review of Systems    Physical Exam:  Ht 1.676 m ('5\' 6"'$ )   Wt 74.8 kg (165 lb)   BMI 26.63 kg/m       Physical Exam  Constitutional:       Appearance: Normal appearance. She is well-developed, well-groomed and normal weight.   HENT:      Head: Normocephalic and atraumatic.      Right Ear: Hearing, tympanic membrane, ear canal and external ear normal.      Left Ear: Hearing, tympanic membrane, ear canal and external ear normal.      Nose: Septal deviation and mucosal edema present.      Right Turbinates: Enlarged.      Left Turbinates: Enlarged.      Comments: Mild  crusting L>R     Mouth/Throat:      Lips: Pink.      Mouth: Mucous membranes are moist.      Pharynx: Oropharynx is clear. Uvula midline.   Eyes:      Extraocular Movements: Extraocular movements intact.   Neck:      Trachea: Phonation normal.   Pulmonary:      Effort: Pulmonary effort is normal.   Musculoskeletal:      Cervical back: Normal range of motion and neck supple.   Lymphadenopathy:      Cervical: No cervical adenopathy.   Skin:     General: Skin is warm.   Neurological:      Mental Status: She is alert and oriented to person, place, and time.      Cranial Nerves: Cranial nerves 2-12 are intact. No facial asymmetry.   Psychiatric:         Attention and Perception: Attention normal.         Mood and Affect: Mood normal.         Speech: Speech normal.         Behavior: Behavior normal. Behavior is cooperative.      Comments: Raspy dysphonia         Assessment:  ENCOUNTER DIAGNOSES     ICD-10-CM   1. ETD (Eustachian tube dysfunction), left  H69.82   2. 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 Hoarseness  R49.0   4. Nasal vestibulitis  J34.89       Plan:  Medical records reviewed on 08/31/2021.  Left ear improved and pt declines audiogram. Cont saline  rinses.   Orders Placed This Encounter   . POCT HEARING/VISION/TYMPANOGRAM (AMB ONLY)   Will start budesonide and bactroban irrigations.  No follow-ups on file.    Mathis Dad, PA-C  08/31/2021, 16:17   The advanced practice clinician's documentation was reviewed/amended in its entirety with the assessment and plan portion completely performed independently by me during this separate encounter.

## 2021-09-22 ENCOUNTER — Other Ambulatory Visit: Payer: Self-pay | Admitting: Nurse Practitioner

## 2021-09-22 MED ORDER — WEGOVY 0.5 MG/0.5ML ~~LOC~~ SOAJ: 0.5000 mg | SUBCUTANEOUS | 0 refills | Status: DC

## 2021-09-24 ENCOUNTER — Telehealth: Payer: Self-pay

## 2021-09-24 NOTE — Telephone Encounter (Signed)
Patient called needs prior authorization for this medicine pharmacy deny it  Semaglutide-Weight Management (WEGOVY) 0.5 MG/0.5ML [facility]  Pharmacy: [address]

## 2021-09-29 NOTE — Telephone Encounter (Signed)
Not seeing pa come through, called pharmacy to get rx insurance info they said that she picked up medication yesterday. Called pt no answer left vm

## 2021-09-30 ENCOUNTER — Telehealth: Payer: Self-pay | Admitting: Nurse Practitioner

## 2021-09-30 NOTE — Telephone Encounter (Signed)
Called pt no answer left vm 

## 2021-09-30 NOTE — Telephone Encounter (Signed)
Called pt back no answer left vm

## 2021-09-30 NOTE — Telephone Encounter (Signed)
PT returning your phone call

## 2021-10-02 NOTE — Telephone Encounter (Signed)
Called pt no answer left vm 

## 2021-10-06 ENCOUNTER — Telehealth: Payer: Self-pay | Admitting: Nurse Practitioner

## 2021-10-06 NOTE — Telephone Encounter (Signed)
Called pt no answer left vm 

## 2021-10-06 NOTE — Telephone Encounter (Signed)
Please call patient back again.  Missed call

## 2021-10-23 ENCOUNTER — Telehealth: Payer: Self-pay | Admitting: Nurse Practitioner

## 2021-10-23 NOTE — Telephone Encounter (Signed)
Please advise 

## 2021-10-23 NOTE — Telephone Encounter (Signed)
Patient called in regard to   Semaglutide-Weight Management (WEGOVY) 0.5 MG/0.5ML [facility]   There is a Consulting civil engineer on med. Will not be in stock before July.  Wants to know if office has any samples, of if there are alternatives. Patient Wants a call back.

## 2021-10-26 NOTE — Telephone Encounter (Signed)
Called pt no answer left vm 

## 2021-10-28 ENCOUNTER — Encounter: Payer: Self-pay | Admitting: Nurse Practitioner

## 2021-10-28 ENCOUNTER — Ambulatory Visit: Payer: 59 | Admitting: Nurse Practitioner

## 2021-10-28 VITALS — BP 105/75 | HR 89 | Ht 64.0 in | Wt 144.0 lb

## 2021-10-28 DIAGNOSIS — I471 Supraventricular tachycardia, unspecified: Secondary | ICD-10-CM

## 2021-10-28 DIAGNOSIS — Z1211 Encounter for screening for malignant neoplasm of colon: Secondary | ICD-10-CM

## 2021-10-28 DIAGNOSIS — E669 Obesity, unspecified: Secondary | ICD-10-CM

## 2021-10-28 MED ORDER — WEGOVY 0.5 MG/0.5ML ~~LOC~~ SOAJ
0.5000 mg | SUBCUTANEOUS | 3 refills | Status: DC
Start: 2021-10-28 — End: 2021-11-25

## 2021-10-28 NOTE — Assessment & Plan Note (Signed)
No complaints of palpitations today,  S1, S2 no murmurs, no S3.Regular rate

## 2021-10-28 NOTE — Patient Instructions (Signed)

## 2021-10-28 NOTE — Telephone Encounter (Signed)
Pt had visit today.

## 2021-10-28 NOTE — Assessment & Plan Note (Signed)
Wt Readings from Last 3 Encounters:  10/28/21 144 lb (65.3 kg)  07/30/21 149 lb (67.6 kg)  04/30/21 155 lb (70.3 kg)  Current BMI 24.70 She has lost5lb  since last visit Currently on Wegovy 0.5 mg injection, Mancel Parsons is now on backorder. RX for wegovy 0.'5mg'$  once weekly injection sent to the pharmacy pending the time that the med will become available again Encouraged to continue to follow healthy low-carb diet, engage in regular exercises at least for 50 minutes weekly. Follow-up in 4 months

## 2021-10-28 NOTE — Progress Notes (Signed)
   Tara Gates     MRN: [id number]      DOB: 07/22/1965   HPI Tara Gates with past medical history of obesity is here for follow up for weight management.  Patient stated that she has been doing well on Wegovy denies nausea states that she has not been having headaches anymore since starting Wegovy, most recent dose of Wegovy was 0.5 mg once weekly injection     Due for shingles vaccine, Tdap vaccine need for both vaccines discussed with patient patient declined vaccines today.   Cologuard ordered for colon cancer screening  ROS Denies recent fever or chills. Denies sinus pressure, nasal congestion, ear pain or sore throat. Denies chest congestion, productive cough or wheezing. Denies chest pains, palpitations and leg swelling Denies abdominal pain, nausea, vomiting,diarrhea or constipation.   Denies dysuria, frequency, hesitancy or incontinence. Denies joint pain, swelling and limitation in mobility. Denies headaches, seizures, numbness, or tingling. Denies depression, anxiety or insomnia. Denies skin break down or rash.   PE  BP 105/75 (BP Location: Right Arm, Patient Position: Sitting, Cuff Size: Normal)   Pulse 89   Ht '5\' 4"'$  (1.626 m)   Wt 144 lb (65.3 kg)   LMP 05/11/2021 (Approximate)   SpO2 99%   BMI 24.72 kg/m   Patient alert and oriented and in no cardiopulmonary distress.  HEENT: No facial asymmetry, EOMI,     Neck supple .  Chest: Clear to auscultation bilaterally.  CVS: S1, S2 no murmurs, no S3.Regular rate.  ABD: Soft non tender.   Ext: No edema  MS: Adequate ROM spine, shoulders, hips and knees.  Skin: Intact, no ulcerations or rash noted.  Psych: Good eye contact, normal affect. Memory intact not anxious or depressed appearing.  CNS: CN 2-12 intact, power,  normal throughout.no focal deficits noted.   Assessment & Plan  Obesity (BMI 30-39.9) Wt Readings from Last 3 Encounters:  10/28/21 144 lb (65.3 kg)  07/30/21 149 lb (67.6 kg)   04/30/21 155 lb (70.3 kg)  Current BMI 24.70 She has lost5lb  since last visit Currently on Wegovy 0.5 mg injection, Mancel Parsons is now on backorder. RX for wegovy 0.'5mg'$  once weekly injection sent to the pharmacy pending the time that the med will become available again Encouraged to continue to follow healthy low-carb diet, engage in regular exercises at least for 50 minutes weekly. Follow-up in 4 months   SVT (supraventricular tachycardia) (HCC) No complaints of palpitations today,  S1, S2 no murmurs, no S3.Regular rate

## 2021-10-29 ENCOUNTER — Ambulatory Visit: Payer: 59 | Admitting: Nurse Practitioner

## 2021-11-12 ENCOUNTER — Other Ambulatory Visit (HOSPITAL_COMMUNITY): Payer: Self-pay | Admitting: Obstetrics & Gynecology

## 2021-11-12 DIAGNOSIS — Z1231 Encounter for screening mammogram for malignant neoplasm of breast: Secondary | ICD-10-CM

## 2021-11-14 IMAGING — MG MM DIGITAL SCREENING BILAT W/ TOMO AND CAD
8 series · 8 of 24 positions shown · non-contrast
Comparison: None.

CLINICAL DATA: Screening.

EXAM:
DIGITAL SCREENING BILATERAL MAMMOGRAM WITH TOMOSYNTHESIS AND CAD
TECHNIQUE: Bilateral screening digital craniocaudal and mediolateral oblique
mammograms were obtained. Bilateral screening digital breast
tomosynthesis was performed. The images were evaluated with
computer-aided detection.

[L CC synth-2D]
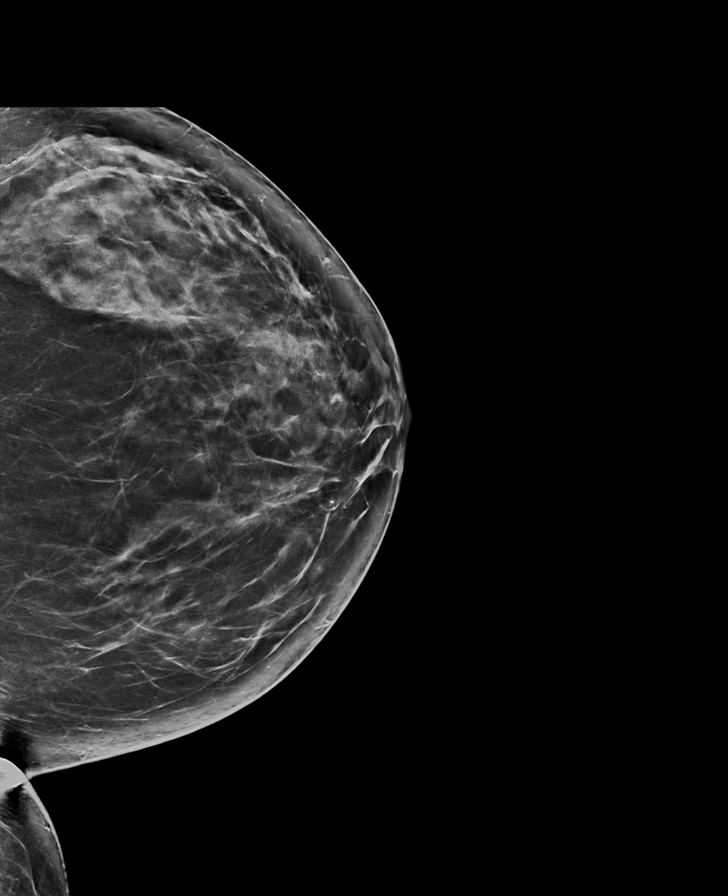

[R MLO synth-2D]
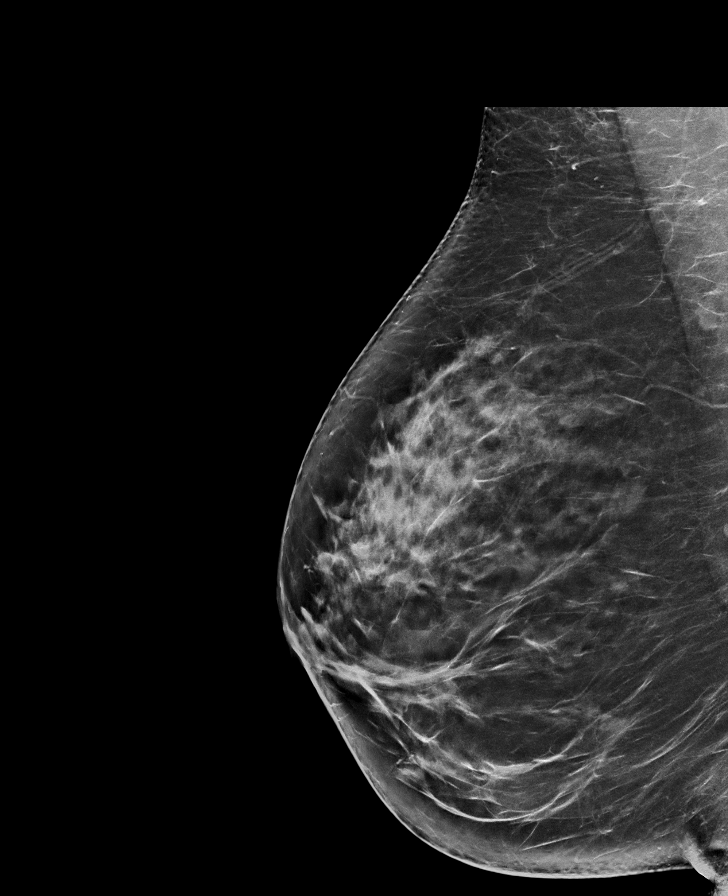

[R CC synth-2D]
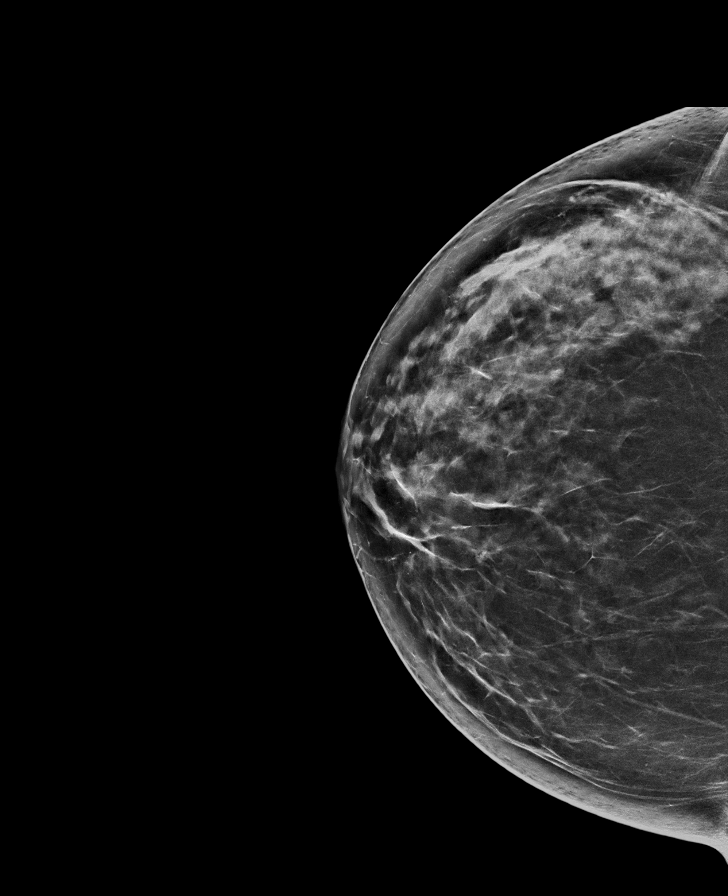

[L MLO synth-2D]
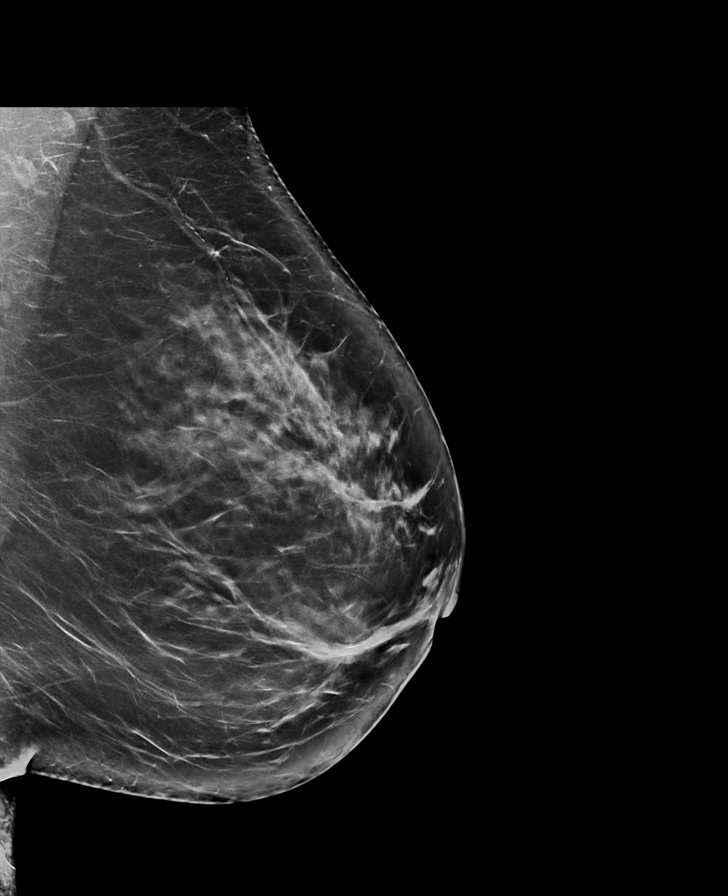

[L MLO tomo · tomo slice 45/89.0]
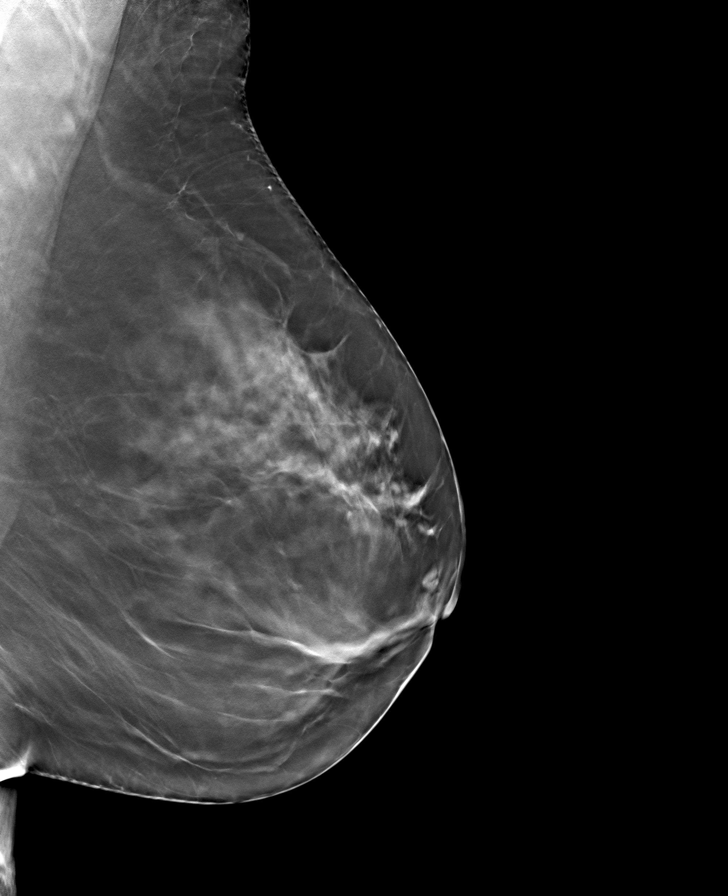

[L CC tomo · tomo slice 41/82.0]
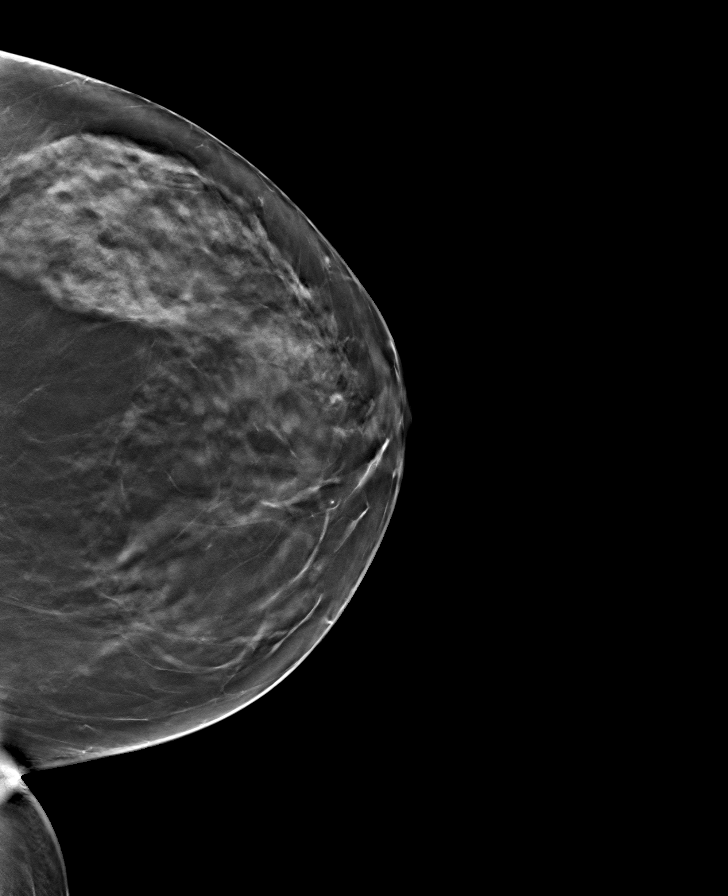

[R CC tomo · tomo slice 43/84.0]
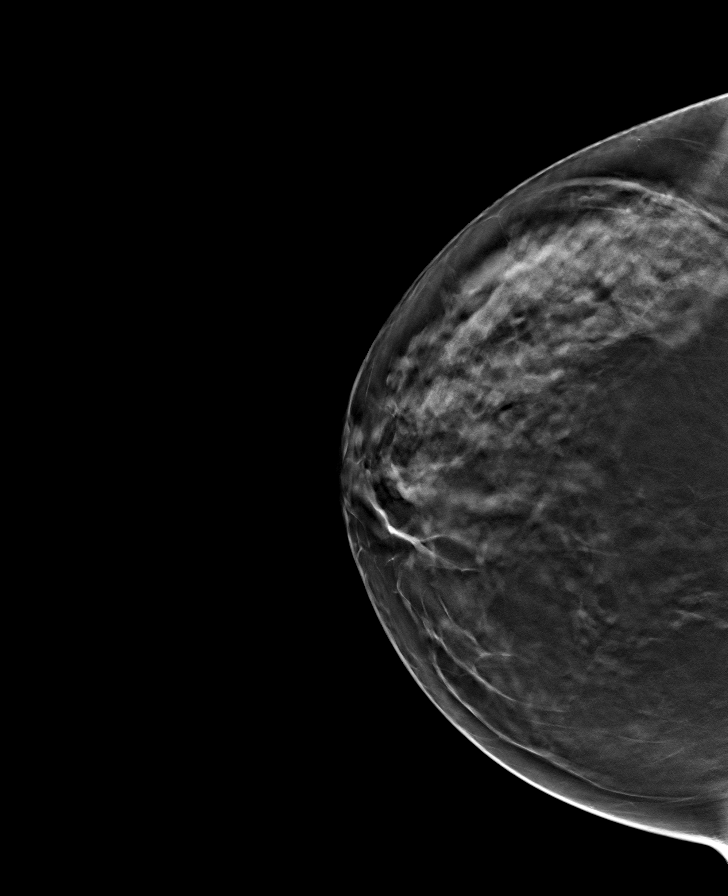

[R MLO tomo · tomo slice 44/87.0]
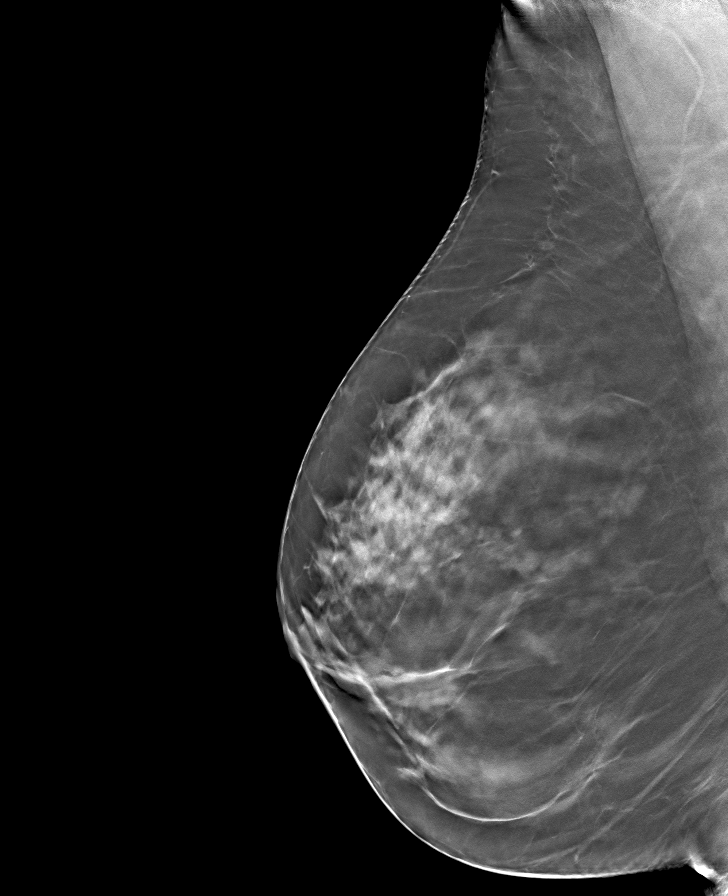

[8 of 24 positions shown; findings below may reference images not displayed]

New baseline.

ACR Breast Density Category c: The breast tissue is heterogeneously
dense, which may obscure small masses
FINDINGS: There are no findings suspicious for malignancy.
IMPRESSION: No mammographic evidence of malignancy. A result letter of this
screening mammogram will be mailed directly to the patient.

RECOMMENDATION:
Screening mammogram in one year. (Code:[id number])

BI-RADS CATEGORY  1: Negative.

## 2021-11-20 ENCOUNTER — Ambulatory Visit (HOSPITAL_COMMUNITY): Payer: 59

## 2021-11-23 ENCOUNTER — Ambulatory Visit (HOSPITAL_COMMUNITY)
Admission: RE | Admit: 2021-11-23 | Discharge: 2021-11-23 | Disposition: A | Discharge status: Home / Self Care | Payer: 59 | Source: Ambulatory Visit | Attending: Obstetrics & Gynecology | Admitting: Obstetrics & Gynecology

## 2021-11-23 DIAGNOSIS — Z1231 Encounter for screening mammogram for malignant neoplasm of breast: Secondary | ICD-10-CM | POA: Insufficient documentation

## 2021-11-24 ENCOUNTER — Encounter: Payer: Self-pay | Admitting: Nurse Practitioner

## 2021-11-25 ENCOUNTER — Other Ambulatory Visit: Payer: Self-pay | Admitting: Nurse Practitioner

## 2021-11-25 MED ORDER — WEGOVY 0.5 MG/0.5ML ~~LOC~~ SOAJ: 0.5000 mg | SUBCUTANEOUS | 3 refills | Status: DC

## 2021-12-01 ENCOUNTER — Encounter (INDEPENDENT_AMBULATORY_CARE_PROVIDER_SITE_OTHER): Payer: Self-pay | Admitting: OTOLARYNGOLOGY

## 2021-12-08 LAB — COLOGUARD: COLOGUARD: NEGATIVE

## 2021-12-09 NOTE — Progress Notes (Signed)
Cologuard test, repeat in 3 years

## 2021-12-17 ENCOUNTER — Encounter: Payer: Self-pay | Admitting: Obstetrics & Gynecology

## 2021-12-17 ENCOUNTER — Other Ambulatory Visit (HOSPITAL_COMMUNITY)
Admission: RE | Admit: 2021-12-17 | Discharge: 2021-12-17 | Disposition: A | Discharge status: Home / Self Care | Payer: 59 | Source: Ambulatory Visit | Attending: Obstetrics & Gynecology | Admitting: Obstetrics & Gynecology

## 2021-12-17 ENCOUNTER — Ambulatory Visit (INDEPENDENT_AMBULATORY_CARE_PROVIDER_SITE_OTHER): Payer: 59 | Admitting: Obstetrics & Gynecology

## 2021-12-17 VITALS — BP 112/72 | HR 80 | Ht 64.0 in | Wt 146.2 lb

## 2021-12-17 DIAGNOSIS — Z01419 Encounter for gynecological examination (general) (routine) without abnormal findings: Secondary | ICD-10-CM | POA: Diagnosis present

## 2021-12-17 DIAGNOSIS — N926 Irregular menstruation, unspecified: Secondary | ICD-10-CM

## 2021-12-17 NOTE — Progress Notes (Signed)
WELL-WOMAN EXAMINATION Patient name: Tara Gates MRN [id number]  Date of birth: [birth year] Chief Complaint:   Gynecologic Exam  History of Present Illness:   Tara Gates is a 56 y.o. [phone] perimenopausal female being seen today for a routine well-woman exam.   Irregular bleeding: She has never gone a full year without a period.  Last year noted occasional light spotting.  She had noted some change when she had started weight loss meds (Rybelsis then [facility]).  Recently she had missed the [facility] and noted light but consistent bleeding for about 1-2 weeks. Prior to this last period Jan 2023   Denies irregular discharge, itching or irritation.  Denies pelvic or abdominal pain. No other acute complaints.  Patient's last menstrual period was 12/03/2021 (approximate).  The current method of family planning is  n/a .    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  Last mammogram: 11/2021. Last colonoscopy: cologuard completed 2023     12/17/2021    8:37 AM 10/28/2021    9:01 AM 07/30/2021    2:32 PM 04/30/2021    3:45 PM 01/29/2021    3:44 PM  Depression screen PHQ 2/9  Decreased Interest 0 0 0 0 0  Down, Depressed, Hopeless 0 0 0 0 0  PHQ - 2 Score 0 0 0 0 0  Altered sleeping 1      Tired, decreased energy 1      Change in appetite 1      Feeling bad or failure about yourself  0      Trouble concentrating 0      Moving slowly or fidgety/restless 0      Suicidal thoughts 0      PHQ-9 Score 3          Review of Systems:   Pertinent items are noted in HPI Denies any headaches, blurred vision, fatigue, shortness of breath, chest pain, abdominal pain, bowel movements, urination, or intercourse unless otherwise stated above.  Pertinent History Reviewed:  Reviewed past medical,surgical, social and family history.  Reviewed problem list, medications and allergies. Physical Assessment:   Vitals:   12/17/21 0830  BP: 112/72   Pulse: 80  Weight: 146 lb 3.2 oz (66.3 kg)  Height: '5\' 4"'$  (1.626 m)  Body mass index is 25.1 kg/m.        Physical Examination:   General appearance - well appearing, and in no distress  Mental status - alert, oriented to person, place, and time  Psych:  She has a normal mood and affect  Skin - warm and dry, normal color, no suspicious lesions noted  Chest - effort normal, all lung fields clear to auscultation bilaterally  Heart - normal rate and regular rhythm  Neck:  midline trachea, no thyromegaly or nodules  Breasts - breasts appear normal, no suspicious masses, no skin or nipple changes or  axillary nodes  Abdomen - soft, nontender, nondistended, no masses or organomegaly  Pelvic - VULVA: normal appearing vulva with no masses, tenderness or lesions  VAGINA: normal appearing vagina with normal color and discharge, no lesions  CERVIX: normal appearing cervix without discharge or lesions, no CMT.  Light spotting noted s/p pap collected.    Thin prep pap is done with HR HPV cotesting  UTERUS: uterus is felt to be normal size, shape, consistency and nontender   ADNEXA: No adnexal masses or tenderness noted.  Extremities:  No swelling or varicosities noted  Chaperone:  Levy Pupa     Assessment & Plan:  1) Well-Woman Exam -pap collected, reviewed screening guidelines -briefly discussed "mismatch" of pap and colposcopy, pending results may consider LEEP vs repeat colposcopy  2) Irregular bleeding -due to change in bleeding and age (>45yo) concern about underlying etiology -plan for pelvic US for further evaluation.  Next step pending results  Orders Placed This Encounter  Procedures   US PELVIC COMPLETE WITH TRANSVAGINAL    Meds: No orders of the defined types were placed in this encounter.   Follow-up: Return in about 1 year (around 12/18/2022) for Annual and please schedule [place] at [facility].   Janyth Pupa, DO Attending [place], [facility]  for [employer], [place]

## 2021-12-18 ENCOUNTER — Ambulatory Visit (HOSPITAL_BASED_OUTPATIENT_CLINIC_OR_DEPARTMENT_OTHER)
Admission: RE | Admit: 2021-12-18 | Discharge: 2021-12-18 | Disposition: A | Discharge status: Home / Self Care | Payer: 59 | Source: Ambulatory Visit | Attending: Obstetrics & Gynecology | Admitting: Obstetrics & Gynecology

## 2021-12-18 DIAGNOSIS — N926 Irregular menstruation, unspecified: Secondary | ICD-10-CM | POA: Diagnosis present

## 2021-12-21 ENCOUNTER — Encounter: Payer: Self-pay | Admitting: Obstetrics & Gynecology

## 2021-12-21 LAB — CYTOLOGY - PAP
Comment: NEGATIVE
High risk HPV: NEGATIVE

## 2021-12-22 ENCOUNTER — Telehealth: Payer: Self-pay | Admitting: Obstetrics & Gynecology

## 2021-12-22 NOTE — Telephone Encounter (Signed)
Called patient to review her concerns.  Discussed [place] results and advised plan for endometrial biopsy at next available.  Pt states she has a trip to [facility] scheduled but then will schedule after that with either myself or Dr. Dorena Bodo, DO Attending [place], [place] for [facility], [place]

## 2021-12-25 ENCOUNTER — Encounter: Payer: Self-pay | Admitting: Obstetrics & Gynecology

## 2021-12-31 ENCOUNTER — Other Ambulatory Visit: Payer: 59 | Admitting: Obstetrics & Gynecology

## 2022-01-08 ENCOUNTER — Other Ambulatory Visit: Payer: Self-pay

## 2022-01-08 ENCOUNTER — Encounter (HOSPITAL_BASED_OUTPATIENT_CLINIC_OR_DEPARTMENT_OTHER): Payer: Self-pay | Admitting: Obstetrics & Gynecology

## 2022-01-12 ENCOUNTER — Other Ambulatory Visit: Payer: Self-pay | Admitting: Obstetrics & Gynecology

## 2022-01-12 DIAGNOSIS — Z01818 Encounter for other preprocedural examination: Secondary | ICD-10-CM

## 2022-01-15 NOTE — Progress Notes (Signed)
Spoke with pt for pre-op call. Pt's surgery was originally scheduled at [facility] but has been moved to the main OR at the hospital. Pt does have a history of SVT but has had an ablation in 2016. Pt is not diabetic.   Shower instructions given to pt and she voiced understanding.

## 2022-01-17 NOTE — Anesthesia Preprocedure Evaluation (Signed)
Anesthesia Evaluation  Patient identified by MRN, date of birth, ID band Patient awake    Reviewed: Allergy & Precautions, NPO status , Patient's Chart, lab work & pertinent test results  History of Anesthesia Complications (+) PONV and history of anesthetic complications  Airway Mallampati: II  TM Distance: >3 FB Neck ROM: Full    Dental  (+) Teeth Intact, Dental Advisory Given   Pulmonary neg pulmonary ROS,    Pulmonary exam normal breath sounds clear to auscultation       Cardiovascular Normal cardiovascular exam+ dysrhythmias (PSVT s/p ablation)  Rhythm:Regular Rate:Normal     Neuro/Psych negative neurological ROS  negative psych ROS   GI/Hepatic negative GI ROS, Neg liver ROS,   Endo/Other  negative endocrine ROS  Renal/GU negative Renal ROS     Musculoskeletal negative musculoskeletal ROS (+)   Abdominal   Peds  Hematology negative hematology ROS (+)   Anesthesia Other Findings   Reproductive/Obstetrics postmenopausal bleeding                            Anesthesia Physical Anesthesia Plan  ASA: 2  Anesthesia Plan: General   Post-op Pain Management: Tylenol PO (pre-op)* and Toradol IV (intra-op)*   Induction: Intravenous  PONV Risk Score and Plan: 4 or greater and Scopolamine patch - Pre-op, Midazolam, TIVA, Dexamethasone and Ondansetron  Airway Management Planned: LMA  Additional Equipment:   Intra-op Plan:   Post-operative Plan: Extubation in OR  Informed Consent: I have reviewed the patients History and Physical, chart, labs and discussed the procedure including the risks, benefits and alternatives for the proposed anesthesia with the patient or authorized representative who has indicated his/her understanding and acceptance.     Dental advisory given  Plan Discussed with: CRNA  Anesthesia Plan Comments:        Anesthesia Quick Evaluation

## 2022-01-18 ENCOUNTER — Ambulatory Visit (HOSPITAL_BASED_OUTPATIENT_CLINIC_OR_DEPARTMENT_OTHER): Payer: 59 | Admitting: Anesthesiology

## 2022-01-18 ENCOUNTER — Ambulatory Visit (HOSPITAL_COMMUNITY)
Admission: RE | Admit: 2022-01-18 | Discharge: 2022-01-18 | Disposition: A | Discharge status: Home / Self Care | Payer: 59 | Attending: Obstetrics & Gynecology | Admitting: Obstetrics & Gynecology

## 2022-01-18 ENCOUNTER — Encounter (HOSPITAL_COMMUNITY)
Admission: RE | Disposition: A | Discharge status: Home / Self Care | Payer: Self-pay | Source: Home / Self Care | Attending: Obstetrics & Gynecology

## 2022-01-18 ENCOUNTER — Encounter (HOSPITAL_COMMUNITY): Payer: Self-pay | Admitting: Obstetrics & Gynecology

## 2022-01-18 ENCOUNTER — Other Ambulatory Visit: Payer: Self-pay

## 2022-01-18 ENCOUNTER — Ambulatory Visit (HOSPITAL_COMMUNITY): Payer: 59 | Admitting: Anesthesiology

## 2022-01-18 DIAGNOSIS — D259 Leiomyoma of uterus, unspecified: Secondary | ICD-10-CM | POA: Diagnosis not present

## 2022-01-18 DIAGNOSIS — R9389 Abnormal findings on diagnostic imaging of other specified body structures: Secondary | ICD-10-CM

## 2022-01-18 DIAGNOSIS — N950 Postmenopausal bleeding: Secondary | ICD-10-CM

## 2022-01-18 DIAGNOSIS — Z01818 Encounter for other preprocedural examination: Secondary | ICD-10-CM

## 2022-01-18 HISTORY — PX: HYSTEROSCOPY WITH D & C: SHX1775

## 2022-01-18 HISTORY — DX: Other specified postprocedural states: Z98.890

## 2022-01-18 HISTORY — DX: Other specified postprocedural states: R11.2

## 2022-01-18 LAB — URINALYSIS, ROUTINE W REFLEX MICROSCOPIC
Bilirubin Urine: NEGATIVE
Glucose, UA: NEGATIVE mg/dL
Ketones, ur: NEGATIVE mg/dL
Leukocytes,Ua: NEGATIVE
Nitrite: NEGATIVE
Protein, ur: 100 mg/dL — AB
Specific Gravity, Urine: 1.017 (ref 1.005–1.030)
pH: 5 (ref 5.0–8.0)

## 2022-01-18 LAB — CBC
HCT: 36.4 % (ref 36.0–46.0)
Hemoglobin: 11.6 g/dL — ABNORMAL LOW (ref 12.0–15.0)
MCH: 31.7 pg (ref 26.0–34.0)
MCHC: 31.9 g/dL (ref 30.0–36.0)
MCV: 99.5 fL (ref 80.0–100.0)
Platelets: 215 10*3/uL (ref 150–400)
RBC: 3.66 MIL/uL — ABNORMAL LOW (ref 3.87–5.11)
RDW: 12.3 % (ref 11.5–15.5)
WBC: 6.8 10*3/uL (ref 4.0–10.5)
nRBC: 0 % (ref 0.0–0.2)

## 2022-01-18 LAB — COMPREHENSIVE METABOLIC PANEL
ALT: 12 U/L (ref 0–44)
AST: 17 U/L (ref 15–41)
Albumin: 3.5 g/dL (ref 3.5–5.0)
Alkaline Phosphatase: 73 U/L (ref 38–126)
Anion gap: 8 (ref 5–15)
BUN: 11 mg/dL (ref 6–20)
CO2: 21 mmol/L — ABNORMAL LOW (ref 22–32)
Calcium: 8.6 mg/dL — ABNORMAL LOW (ref 8.9–10.3)
Chloride: 110 mmol/L (ref 98–111)
Creatinine, Ser: 0.88 mg/dL (ref 0.44–1.00)
GFR, Estimated: >60 mL/min (ref >60)
Glucose, Bld: 79 mg/dL (ref 70–99)
Potassium: 3.6 mmol/L (ref 3.5–5.1)
Sodium: 139 mmol/L (ref 135–145)
Total Bilirubin: 0.6 mg/dL (ref 0.3–1.2)
Total Protein: 6.9 g/dL (ref 6.5–8.1)

## 2022-01-18 LAB — POCT PREGNANCY, URINE: Preg Test, Ur: NEGATIVE

## 2022-01-18 SURGERY — DILATATION AND CURETTAGE /HYSTEROSCOPY
Anesthesia: General

## 2022-01-18 MED ORDER — MIDAZOLAM HCL 2 MG/2ML IJ SOLN
INTRAMUSCULAR | Status: AC
Start: 2022-01-18 — End: ?
  Filled 2022-01-18: qty 2

## 2022-01-18 MED ORDER — LIDOCAINE 2% (20 MG/ML) 5 ML SYRINGE
INTRAMUSCULAR | Status: DC | PRN
  Administered 2022-01-18: 60 mg via INTRAVENOUS

## 2022-01-18 MED ORDER — PHENYLEPHRINE 80 MCG/ML (10ML) SYRINGE FOR IV PUSH (FOR BLOOD PRESSURE SUPPORT)
PREFILLED_SYRINGE | INTRAVENOUS | Status: DC | PRN
  Administered 2022-01-18: 80 ug via INTRAVENOUS

## 2022-01-18 MED ORDER — CHLORHEXIDINE GLUCONATE 0.12 % MT SOLN
OROMUCOSAL | Status: AC
  Administered 2022-01-18: 15 mL
  Filled 2022-01-18: qty 15

## 2022-01-18 MED ORDER — PROPOFOL 10 MG/ML IV BOLUS
INTRAVENOUS | Status: AC
  Filled 2022-01-18: qty 20

## 2022-01-18 MED ORDER — ONDANSETRON HCL 4 MG/2ML IJ SOLN
INTRAMUSCULAR | Status: DC | PRN
  Administered 2022-01-18: 4 mg via INTRAVENOUS

## 2022-01-18 MED ORDER — MIDAZOLAM HCL 2 MG/2ML IJ SOLN
INTRAMUSCULAR | Status: DC | PRN
  Administered 2022-01-18: 1 mg via INTRAVENOUS

## 2022-01-18 MED ORDER — FENTANYL CITRATE (PF) 100 MCG/2ML IJ SOLN: 25.0000 ug | INTRAMUSCULAR | Status: DC | PRN

## 2022-01-18 MED ORDER — KETOROLAC TROMETHAMINE 30 MG/ML IJ SOLN
30.0000 mg | Freq: Once | INTRAMUSCULAR | Status: AC
  Administered 2022-01-18: 30 mg via INTRAVENOUS
  Filled 2022-01-18 (×2): qty 1

## 2022-01-18 MED ORDER — FENTANYL CITRATE (PF) 250 MCG/5ML IJ SOLN
INTRAMUSCULAR | Status: AC
  Filled 2022-01-18: qty 5

## 2022-01-18 MED ORDER — FENTANYL CITRATE (PF) 250 MCG/5ML IJ SOLN
INTRAMUSCULAR | Status: DC | PRN
  Administered 2022-01-18: 50 ug via INTRAVENOUS

## 2022-01-18 MED ORDER — PROPOFOL 10 MG/ML IV BOLUS
INTRAVENOUS | Status: DC | PRN
  Administered 2022-01-18: 150 mg via INTRAVENOUS
  Administered 2022-01-18: 30 mg via INTRAVENOUS

## 2022-01-18 MED ORDER — ACETAMINOPHEN 500 MG PO TABS
1000.0000 mg | ORAL_TABLET | Freq: Once | ORAL | Status: AC
  Administered 2022-01-18: 1000 mg via ORAL
  Filled 2022-01-18: qty 2

## 2022-01-18 MED ORDER — KETOROLAC TROMETHAMINE 10 MG PO TABS: 10.0000 mg | ORAL_TABLET | Freq: Three times a day (TID) | ORAL | 0 refills | Status: DC | PRN

## 2022-01-18 MED ORDER — POVIDONE-IODINE 10 % EX SWAB
2.0000 | Freq: Once | CUTANEOUS | Status: AC
  Administered 2022-01-18: 2 via TOPICAL

## 2022-01-18 MED ORDER — CEFAZOLIN SODIUM-DEXTROSE 2-4 GM/100ML-% IV SOLN
2.0000 g | INTRAVENOUS | Status: AC
  Administered 2022-01-18: 2 g via INTRAVENOUS
  Filled 2022-01-18: qty 100

## 2022-01-18 MED ORDER — PROMETHAZINE HCL 25 MG/ML IJ SOLN: 6.2500 mg | INTRAMUSCULAR | Status: DC | PRN

## 2022-01-18 MED ORDER — HYDROCODONE-ACETAMINOPHEN 5-325 MG PO TABS: 1.0000 | ORAL_TABLET | Freq: Four times a day (QID) | ORAL | 0 refills | Status: DC | PRN

## 2022-01-18 MED ORDER — ONDANSETRON HCL 4 MG/2ML IJ SOLN
INTRAMUSCULAR | Status: AC
  Filled 2022-01-18: qty 2

## 2022-01-18 MED ORDER — PHENYLEPHRINE HCL-NACL 20-0.9 MG/250ML-% IV SOLN
INTRAVENOUS | Status: DC | PRN
  Administered 2022-01-18: 150 ug/min via INTRAVENOUS

## 2022-01-18 MED ORDER — DEXAMETHASONE SODIUM PHOSPHATE 10 MG/ML IJ SOLN
INTRAMUSCULAR | Status: DC | PRN
  Administered 2022-01-18: 10 mg via INTRAVENOUS

## 2022-01-18 MED ORDER — DEXAMETHASONE SODIUM PHOSPHATE 10 MG/ML IJ SOLN
INTRAMUSCULAR | Status: AC
  Filled 2022-01-18: qty 1

## 2022-01-18 MED ORDER — SCOPOLAMINE 1 MG/3DAYS TD PT72
1.0000 | MEDICATED_PATCH | Freq: Once | TRANSDERMAL | Status: DC
  Administered 2022-01-18: 1.5 mg via TRANSDERMAL
  Filled 2022-01-18: qty 1

## 2022-01-18 MED ORDER — BUPIVACAINE HCL (PF) 0.5 % IJ SOLN
INTRAMUSCULAR | Status: AC
  Filled 2022-01-18: qty 30

## 2022-01-18 MED ORDER — LACTATED RINGERS IV SOLN
INTRAVENOUS | Status: DC
Start: 2022-01-18 — End: 2022-01-18

## 2022-01-18 SURGICAL SUPPLY — 12 items
CATH ROBINSON RED A/P 16FR (CATHETERS) ×1 IMPLANT
CNTNR URN SCR LID CUP LEK RST (MISCELLANEOUS) ×1 IMPLANT
CONT SPEC 4OZ STRL OR WHT (MISCELLANEOUS) ×1
GLOVE BIOGEL PI IND STRL 7.0 (GLOVE) ×1 IMPLANT
GLOVE ECLIPSE 7.5 STRL STRAW (GLOVE) IMPLANT
GOWN STRL REUS W/ TWL LRG LVL3 (GOWN DISPOSABLE) ×2 IMPLANT
GOWN STRL REUS W/TWL LRG LVL3 (GOWN DISPOSABLE) ×2
KIT TURNOVER KIT B (KITS) ×1 IMPLANT
PACK VAGINAL MINOR WOMEN LF (CUSTOM PROCEDURE TRAY) ×1 IMPLANT
PAD OB MATERNITY 4.3X12.25 (PERSONAL CARE ITEMS) ×1 IMPLANT
TOWEL GREEN STERILE FF (TOWEL DISPOSABLE) ×2 IMPLANT
UNDERPAD 30X36 HEAVY ABSORB (UNDERPADS AND DIAPERS) ×1 IMPLANT

## 2022-01-18 NOTE — Op Note (Signed)
Preoperative diagnosis: Postmenopausal bleeding                                         Thickened endometrium   Postoperative diagnosis: SAA  Procedure: Diagnostic hysteroscopy with uterine curettage  Surgeon: Florian Buff   Anesthesia: Laryngeal mask airway   Findings: The patient experienced postmenopausal bleeding and we did a sonogram in the office which revealed a thickened endometrial stripe of 25 mm.     Today the endometrium is normal in appearance, no polyps or fibroids noted  Description of operation: Patient was taken to the operating room and placed in the supine position where she underwent laryngeal mask airway anesthesia. She was placed in the dorsal lithotomy position.  She was prepped and draped in the usual sterile fashion.  A Graves speculum was placed.  The cervix was grasped with a single-tooth tenaculum.  The cervix was dilated serially using Hegar dilators to allow passage of the hysteroscope.  The hysteroscope was then placed into the endometrial cavity without difficulty and the above noted findings were seen.   The endometrium was thoroughly curetted with good uterine cry in all areas  Hysteroscopic reevaluation confirmed the removal of all endometrial pathology  There was good hemostasis with only suspected minor endometrial using  The patient tolerated the procedure well  She experienced minimal blood loss  She was taken to the recovery room in good stable condition  All counts were correct x3  She received 2 g of Ancef and 30 mg of Toradol preoperatively  She will be followed up in the office next week for postoperative visit and to review the pathology which I expect to be benign  Florian Buff, MD 01/18/2022 12:27 PM

## 2022-01-18 NOTE — Anesthesia Postprocedure Evaluation (Signed)
Anesthesia Post Note  Patient: Tara Gates  Procedure(s) Performed: DILATATION AND CURETTAGE /HYSTEROSCOPY     Patient location during evaluation: PACU Anesthesia Type: General Level of consciousness: awake and alert Pain management: pain level controlled Vital Signs Assessment: post-procedure vital signs reviewed and stable Respiratory status: spontaneous breathing, nonlabored ventilation, respiratory function stable and patient connected to nasal cannula oxygen Cardiovascular status: blood pressure returned to baseline and stable Postop Assessment: no apparent nausea or vomiting Anesthetic complications: no   No notable events documented.  Last Vitals:  Vitals:   01/18/22 1300 01/18/22 1315  BP: 102/73 103/76  Pulse: 68 64  Resp: 19 11  Temp:  36.7 C  SpO2: 96% 98%    Last Pain:  Vitals:   01/18/22 1315  PainSc: 0-No pain                 Santa Lighter

## 2022-01-18 NOTE — Transfer of Care (Signed)
Immediate Anesthesia Transfer of Care Note  Patient: Tara Gates  Procedure(s) Performed: DILATATION AND CURETTAGE /HYSTEROSCOPY  Patient Location: PACU  Anesthesia Type:General  Level of Consciousness: drowsy and patient cooperative  Airway & Oxygen Therapy: Patient Spontanous Breathing and Patient connected to nasal cannula oxygen  Post-op Assessment: Report given to RN, Post -op Vital signs reviewed and stable and Patient moving all extremities X 4  Post vital signs: Reviewed and stable  Last Vitals:  Vitals Value Taken Time  BP 99/69 01/18/22 1232  Temp    Pulse 86 01/18/22 1234  Resp 24 01/18/22 1234  SpO2 98 % 01/18/22 1234  Vitals shown include unvalidated device data.  Last Pain:  Vitals:   01/18/22 0944  PainSc: 0-No pain         Complications: No notable events documented.

## 2022-01-18 NOTE — H&P (Signed)
Preoperative History and Physical  Tara Gates is a 56 y.o. [id number] with Patient's last menstrual period was 12/28/2021 (exact date). admitted for a hysteroscopy uterine curettage due to post menoapausal bleeding with significantly thickened endometrium, 25 mm.  Needs complete evaluation due to high risk of significant endometrial abnormalities at this thickness and inadequacy of office EMB for this thickness  PMH:    Past Medical History:  Diagnosis Date   Palpitations    PONV (postoperative nausea and vomiting)    PSVT (paroxysmal supraventricular tachycardia) (HCC)    ablation 2016   Vaginal Pap smear, abnormal     PSH:     Past Surgical History:  Procedure Laterality Date   ELECTROPHYSIOLOGIC STUDY N/A 09/13/2014   Procedure: A-Flutter/A-Tach/Svt Ablation;  Surgeon: Evans Lance, MD;  Location: [facility];  Service: Cardiovascular;  Laterality: N/A;   WISDOM TOOTH EXTRACTION      POb/GynH:      OB History     Gravida  3   Para  2   Term  2   Preterm      AB  1   Living  2      SAB  1   IAB      Ectopic      Multiple      Live Births  2           SH:   Social History   Tobacco Use   Smoking status: Never   Smokeless tobacco: Never  Vaping Use   Vaping Use: Never used  Substance Use Topics   Alcohol use: No   Drug use: No    FH:    Family History  Problem Relation Age of Onset   Breast cancer Maternal Grandmother    Cancer Father    Lung cancer Father    Heart attack Mother 36   Cancer Brother        bladder     Allergies: No Known Allergies  Medications:       Current Facility-Administered Medications:    ceFAZolin (ANCEF) IVPB 2g/100 mL premix, 2 g, Intravenous, On Call to OR, Florian Buff, MD   ketorolac (TORADOL) 30 MG/ML injection 30 mg, 30 mg, Intravenous, Once, Lanise Mergen, Mertie Clause, MD   lactated ringers infusion, , Intravenous, Continuous, Hatchett, Franklin, MD, Last Rate: 10 mL/hr at 01/18/22 1002,  Continued from Pre-op at 01/18/22 1002   scopolamine (TRANSDERM-SCOP) 1 MG/3DAYS 1.5 mg, 1 patch, Transdermal, Once, Santa Lighter, MD, 1.5 mg at 01/18/22 2778  Review of Systems:   Review of Systems  Constitutional: Negative for fever, chills, weight loss, malaise/fatigue and diaphoresis.  HENT: Negative for hearing loss, ear pain, nosebleeds, congestion, sore throat, neck pain, tinnitus and ear discharge.   Eyes: Negative for blurred vision, double vision, photophobia, pain, discharge and redness.  Respiratory: Negative for cough, hemoptysis, sputum production, shortness of breath, wheezing and stridor.   Cardiovascular: Negative for chest pain, palpitations, orthopnea, claudication, leg swelling and PND.  Gastrointestinal: Positive for abdominal pain. Negative for heartburn, nausea, vomiting, diarrhea, constipation, blood in stool and melena.  Genitourinary: Negative for dysuria, urgency, frequency, hematuria and flank pain.  Musculoskeletal: Negative for myalgias, back pain, joint pain and falls.  Skin: Negative for itching and rash.  Neurological: Negative for dizziness, tingling, tremors, sensory change, speech change, focal weakness, seizures, loss of consciousness, weakness and headaches.  Endo/Heme/Allergies: Negative for environmental allergies and polydipsia. Does not bruise/bleed easily.  Psychiatric/Behavioral: Negative for  depression, suicidal ideas, hallucinations, memory loss and substance abuse. The patient is not nervous/anxious and does not have insomnia.      PHYSICAL EXAM:  Blood pressure 111/75, pulse 76, temperature 98.5 F (36.9 C), resp. rate 17, height '5\' 4"'$  (1.626 m), weight 64.4 kg, last menstrual period 12/28/2021, SpO2 98 %.    Vitals reviewed. Constitutional: She is oriented to person, place, and time. She appears well-developed and well-nourished.  HENT:  Head: Normocephalic and atraumatic.  Right Ear: External ear normal.  Left Ear: External ear  normal.  Nose: Nose normal.  Mouth/Throat: Oropharynx is clear and moist.  Eyes: Conjunctivae and EOM are normal. Pupils are equal, round, and reactive to light. Right eye exhibits no discharge. Left eye exhibits no discharge. No scleral icterus.  Neck: Normal range of motion. Neck supple. No tracheal deviation present. No thyromegaly present.  Cardiovascular: Normal rate, regular rhythm, normal heart sounds and intact distal pulses.  Exam reveals no gallop and no friction rub.   No murmur heard. Respiratory: Effort normal and breath sounds normal. No respiratory distress. She has no wheezes. She has no rales. She exhibits no tenderness.  GI: Soft. Bowel sounds are normal. She exhibits no distension and no mass. There is tenderness. There is no rebound and no guarding.  Genitourinary:       Vulva is normal without lesions Vagina is pink moist without discharge Cervix normal in appearance and pap is normal Uterus is normal size, contour, position, consistency, mobility, non-tender Adnexa is negative with normal sized ovaries by sonogram  Musculoskeletal: Normal range of motion. She exhibits no edema and no tenderness.  Neurological: She is alert and oriented to person, place, and time. She has normal reflexes. She displays normal reflexes. No cranial nerve deficit. She exhibits normal muscle tone. Coordination normal.  Skin: Skin is warm and dry. No rash noted. No erythema. No pallor.  Psychiatric: She has a normal mood and affect. Her behavior is normal. Judgment and thought content normal.    Labs: Results for orders placed or performed during the hospital encounter of 01/18/22 (from the past 336 hour(s))  CBC   Collection Time: 01/18/22  9:56 AM  Result Value Ref Range   WBC 6.8 4.0 - 10.5 K/uL   RBC 3.66 (L) 3.87 - 5.11 MIL/uL   Hemoglobin 11.6 (L) 12.0 - 15.0 g/dL   HCT 36.4 36.0 - 46.0 %   MCV 99.5 80.0 - 100.0 fL   MCH 31.7 26.0 - 34.0 pg   MCHC 31.9 30.0 - 36.0 g/dL   RDW 12.3  11.5 - 15.5 %   Platelets 215 150 - 400 K/uL   nRBC 0.0 0.0 - 0.2 %  Pregnancy, urine POC   Collection Time: 01/18/22 10:02 AM  Result Value Ref Range   Preg Test, Ur NEGATIVE NEGATIVE    EKG: Orders placed or performed during the hospital encounter of 01/18/22   EKG 12-Lead   EKG 12-Lead    Imaging Studies: No results found.    Assessment: POst menopausal bleeding Thickened endometrium on sonogram, >11 mm Plan: Hysteroscopy uterine curettage for endometrial evalaution  Florian Buff 01/18/2022 10:19 AM

## 2022-01-18 NOTE — Anesthesia Procedure Notes (Signed)
Procedure Name: LMA Insertion Date/Time: 01/18/2022 11:53 AM  Performed by: Gaylene Brooks, CRNAPre-anesthesia Checklist: Patient identified, Emergency Drugs available, Suction available and Patient being monitored Patient Re-evaluated:Patient Re-evaluated prior to induction Oxygen Delivery Method: Circle System Utilized Preoxygenation: Pre-oxygenation with 100% oxygen Induction Type: IV induction Ventilation: Mask ventilation without difficulty LMA: LMA inserted LMA Size: 4.0 Number of attempts: 1 Airway Equipment and Method: Bite block Placement Confirmation: positive ETCO2 Tube secured with: Tape Dental Injury: Teeth and Oropharynx as per pre-operative assessment

## 2022-01-19 ENCOUNTER — Encounter (HOSPITAL_COMMUNITY): Payer: Self-pay | Admitting: Obstetrics & Gynecology

## 2022-01-20 LAB — SURGICAL PATHOLOGY

## 2022-01-25 ENCOUNTER — Encounter (INDEPENDENT_AMBULATORY_CARE_PROVIDER_SITE_OTHER): Payer: Self-pay | Admitting: OTOLARYNGOLOGY

## 2022-01-25 ENCOUNTER — Other Ambulatory Visit: Payer: Self-pay

## 2022-01-25 ENCOUNTER — Ambulatory Visit (INDEPENDENT_AMBULATORY_CARE_PROVIDER_SITE_OTHER): Payer: 59 | Admitting: OTOLARYNGOLOGY

## 2022-01-25 VITALS — Ht 66.0 in | Wt 165.0 lb

## 2022-01-25 DIAGNOSIS — R490 Dysphonia: Secondary | ICD-10-CM

## 2022-01-25 DIAGNOSIS — H6592 Unspecified nonsuppurative otitis media, left ear: Secondary | ICD-10-CM

## 2022-01-25 DIAGNOSIS — M3130 Wegener's granulomatosis without renal involvement: Secondary | ICD-10-CM

## 2022-01-25 DIAGNOSIS — J329 Chronic sinusitis, unspecified: Secondary | ICD-10-CM

## 2022-01-25 DIAGNOSIS — H6982 Other specified disorders of Eustachian tube, left ear: Secondary | ICD-10-CM

## 2022-01-25 DIAGNOSIS — J3489 Other specified disorders of nose and nasal sinuses: Secondary | ICD-10-CM

## 2022-01-25 NOTE — Procedures (Signed)
[facility]  [address]  [place] [address]    Procedure Note    Name: MALIJAH LIETZ MRN:  [id number]   Date: 01/25/2022 Age: 56 y.o.  DOB:   August 15, 1965       31231 - NASAL ENDOSCOPY DIAGNOSTIC UNILATERAL OR BILATERAL (AMB ONLY)  Performed by: Dia Sitter, DO  Authorized by: Dia Sitter, DO     Time Out:     Immediately before the procedure, a time out was called:  Yes    Patient verified:  Yes    Procedure Verified:  Yes    Site Verified:  Yes  Documentation:      Indications for procedure: Unilateral middle ear effusion    Anesthesia: Oxymetazoline nasal spray    Description: Nasal endoscopy with rigid scope was performed with examination of the  septum, inferior, middle, and superior meatus, turbinates, sphenoethmoidal recess, and nasopharynx.     There were no polyps, pus, or granulation tissue noted.  ET orifices and nasopharynx were normal.     Findings: Allergic rhinitis    The patient tolerated the procedure well.             Dia Sitter, DO

## 2022-01-25 NOTE — H&P (Signed)
[facility]  [address]  [place] [address]  Phone: [phone]  Fax: [phone]      Encounter Date: 01/25/2022    Patient ID: Martha Deleon  MRN: [id number]    DOB: June 02, 1965  Age: 56 y.o. female     Progress Note       Referring Provider:  Dia Sitter, DO    Reason for Visit:   Chief Complaint   Patient presents with    Follow Up 3 Months     Patient here for 3 month follow up on ears and sinus        History of Present Illness:  Martha Deleon is a 56 y.o. female who is FU on sinuses, Wegener's and ears. Her sinuses have more congestion and pressure lately. She has run out of the topical compound irrigations but still using saline.   The left started clogging again x 1 week. Asso with popping and decreased hearing. Her pulmonologist is planning to order a sinus xray next month    Tympanogram: AD Type A        AS Type B      Patient History:  Patient Active Problem List   Diagnosis    Basal cell carcinoma of nose    Dysphonia    Granulomatosis with polyangiitis (CMS HCC)     Current Outpatient Medications   Medication Sig    albuterol sulfate (PROVENTIL OR VENTOLIN OR PROAIR) 90 mcg/actuation Inhalation oral inhaler     carvediloL (COREG) 3.125 mg Oral Tablet Take 1 Tablet (3.125 mg total) by mouth    cyanocobalamin (VITAMIN B12) 1,000 mcg/mL Injection Solution     diazePAM (VALIUM) 2 mg Oral Tablet     Ibuprofen (MOTRIN) 800 mg Oral Tablet     montelukast (SINGULAIR) 10 mg Oral Tablet     NORTREL 7/7/7, 28, 0.5/0.75/1 mg- 35 mcg Oral Tablet     omeprazole (PRILOSEC) 40 mg Oral Capsule, Delayed Release(E.C.)       Allergies   Allergen Reactions    Iodinated Contrast Media Shortness of Breath, Hives/ Urticaria and Nausea/ Vomiting     Other reaction(s): Flushing (ALLERGY/intolerance)    Penicillins Rash    Sulfamethoxazole Rash     Past Medical History:   Diagnosis Date    Asthma     Autoimmune disease (CMS HCC)     Cancer (CMS HCC)     Esophageal reflux     Migraine      History reviewed.  No pertinent surgical history.  Family Medical History:    None         Social History     Tobacco Use    Smoking status: Never    Smokeless tobacco: Never       Review of Systems:  Review of Systems    Physical Exam:  Ht 1.676 m ('5\' 6"'$ )   Wt 74.8 kg (165 lb)   BMI 26.63 kg/m       Physical Exam  Constitutional:       Appearance: Normal appearance. She is well-developed, well-groomed and normal weight.   HENT:      Head: Normocephalic and atraumatic.      Right Ear: Hearing, tympanic membrane, ear canal and external ear normal.      Left Ear: Hearing, ear canal and external ear normal. A middle ear effusion is present.      Nose: Septal deviation and mucosal edema present.      Right Turbinates: Enlarged.  Left Turbinates: Enlarged.      Mouth/Throat:      Lips: Pink.      Mouth: Mucous membranes are moist.      Pharynx: Oropharynx is clear. Uvula midline.   Eyes:      Extraocular Movements: Extraocular movements intact.   Neck:      Trachea: Phonation normal.   Pulmonary:      Effort: Pulmonary effort is normal.   Musculoskeletal:      Cervical back: Normal range of motion and neck supple.   Lymphadenopathy:      Cervical: No cervical adenopathy.   Skin:     General: Skin is warm.   Neurological:      Mental Status: She is alert and oriented to person, place, and time.      Cranial Nerves: Cranial nerves 2-12 are intact. No facial asymmetry.   Psychiatric:         Attention and Perception: Attention normal.         Mood and Affect: Mood normal.         Speech: Speech normal.         Behavior: Behavior normal. Behavior is cooperative.       Assessment:  ENCOUNTER DIAGNOSES     ICD-10-CM   1. ETD (Eustachian tube dysfunction), left  H69.82   2. 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 Hoarseness  R49.0   4. Nasal vestibulitis  J34.89   5. MEE (middle ear effusion), left  H65.92       Plan:  Medical records reviewed on 01/25/2022.  Will order audiogram. Will restart bactroban  budesonide.   Orders Placed This Encounter    [phone] - NASAL ENDOSCOPY DIAGNOSTIC UNILATERAL OR BILATERAL (AMB ONLY)    AMB PRN REFERRAL EXTERNAL AUDIOLOGIST    POCT HEARING/VISION/TYMPANOGRAM (AMB ONLY)     Return for [facility] up after CT and audiogram.    Mathis Dad, PA-C  01/25/2022, 14:32   The advanced practice clinician's documentation was reviewed/amended in its entirety with the assessment and plan portion completely performed independently by me during this separate encounter.

## 2022-01-25 NOTE — Addendum Note (Signed)
Addended by: Leeroy Cha on: 01/25/2022 03:12 PM     Modules accepted: Orders

## 2022-01-26 ENCOUNTER — Encounter: Payer: Self-pay | Admitting: Obstetrics & Gynecology

## 2022-01-26 ENCOUNTER — Ambulatory Visit (INDEPENDENT_AMBULATORY_CARE_PROVIDER_SITE_OTHER): Payer: 59 | Admitting: Obstetrics & Gynecology

## 2022-01-26 VITALS — BP 107/71 | HR 71 | Wt 143.0 lb

## 2022-01-26 DIAGNOSIS — R9389 Abnormal findings on diagnostic imaging of other specified body structures: Secondary | ICD-10-CM

## 2022-01-26 DIAGNOSIS — N950 Postmenopausal bleeding: Secondary | ICD-10-CM

## 2022-01-26 MED ORDER — MEDROXYPROGESTERONE ACETATE 10 MG PO TABS: 10.0000 mg | ORAL_TABLET | Freq: Every day | ORAL | 11 refills | Status: DC

## 2022-01-26 NOTE — Progress Notes (Signed)
Follow up appointment for results: PMB w/thickened endometrium  Chief Complaint  Patient presents with   Routine Post Op    Blood pressure 107/71, pulse 71, weight 143 lb (64.9 kg), last menstrual period 12/28/2021.  Benign proliferative endometrium, 25 mm stripe    MEDS ordered this encounter: Meds ordered this encounter  Medications   medroxyPROGESTERone (PROVERA) 10 MG tablet    Sig: Take 1 tablet (10 mg total) by mouth daily. As directed    Dispense:  30 tablet    Refill:  11    Orders for this encounter: No orders of the defined types were placed in this encounter.   Impression + Management Plan   ICD-10-CM   1. PMB (postmenopausal bleeding)  N95.0    with benign proliferative will provide progesterone cyclically to suppress the endometrium, pt will send message in 3 months =t o give me bleeding history    2. Thickened endometrium  R93.89       Follow Up: Return if symptoms worsen or fail to improve, for my chart message follow up.     All questions were answered.  Past Medical History:  Diagnosis Date   Palpitations    PONV (postoperative nausea and vomiting)    PSVT (paroxysmal supraventricular tachycardia) ([facility])    ablation 2016   Vaginal Pap smear, abnormal     Past Surgical History:  Procedure Laterality Date   ELECTROPHYSIOLOGIC STUDY N/A 09/13/2014   Procedure: A-Flutter/A-Tach/Svt Ablation;  Surgeon: Evans Lance, MD;  Location: [facility];  Service: Cardiovascular;  Laterality: N/A;   HYSTEROSCOPY WITH D & C N/A 01/18/2022   Procedure: DILATATION AND CURETTAGE /HYSTEROSCOPY;  Surgeon: Florian Buff, MD;  Location: [facility];  Service: Gynecology;  Laterality: N/A;   WISDOM TOOTH EXTRACTION      OB History     Gravida  3   Para  2   Term  2   Preterm      AB  1   Living  2      SAB  1   IAB      Ectopic      Multiple      Live Births  2           No Known Allergies  Social History   Socioeconomic  History   Marital status: Married    Spouse name: Not on file   Number of children: 2   Years of education: Not on file   Highest education level: Not on file  Occupational History   Occupation: [employer]  Tobacco Use   Smoking status: Never   Smokeless tobacco: Never  Vaping Use   Vaping Use: Never used  Substance and Sexual Activity   Alcohol use: No   Drug use: No   Sexual activity: Not Currently    Birth control/protection: None    Comment: bleeding now, started 12-28-21  Other Topics Concern   Not on file  Social History Narrative   Not on file   Social Determinants of Health   Financial Resource Strain: Low Risk  (12/17/2021)   Overall Financial Resource Strain (CARDIA)    Difficulty of Paying Living Expenses: Not very hard  Food Insecurity: No Food Insecurity (12/17/2021)   Hunger Vital Sign    Worried About Running Out of Food in the Last Year: Never true    Ran Out of Food in the Last Year: Never true  Transportation Needs: No Transportation Needs (12/17/2021)  PRAPARE - Hydrologist (Medical): No    Lack of Transportation (Non-Medical): No  Physical Activity: Insufficiently Active (12/17/2021)   Exercise Vital Sign    Days of Exercise per Week: 3 days    Minutes of Exercise per Session: 30 min  Stress: No Stress Concern Present (12/17/2021)   [place]    Feeling of Stress : Only a little  Social Connections: Socially Integrated (12/17/2021)   Social Connection and Isolation Panel [NHANES]    Frequency of Communication with Friends and Family: More than three times a week    Frequency of Social Gatherings with Friends and Family: More than three times a week    Attends Religious Services: More than 4 times per year    Active Member of [employer] or Organizations: Yes    Attends Music therapist: More than 4 times per year    Marital  Status: Married    Family History  Problem Relation Age of Onset   Breast cancer Maternal Grandmother    Cancer Father    Lung cancer Father    Heart attack Mother 34   Cancer Brother        bladder

## 2022-02-26 ENCOUNTER — Ambulatory Visit: Payer: 59 | Admitting: Family Medicine

## 2022-02-26 DIAGNOSIS — E669 Obesity, unspecified: Secondary | ICD-10-CM

## 2022-02-26 NOTE — Patient Instructions (Signed)
I appreciate the opportunity to provide care to you today!    Follow up:  4 months  Please continue to a heart-healthy diet and increase your physical activities. Try to exercise for 30mins at least three times a week.      It was a pleasure to see you and I look forward to continuing to work together on your health and well-being. Please do not hesitate to call the office if you need care or have questions about your care.   Have a wonderful day and week. With Gratitude, Addi Pak MSN, FNP-BC  

## 2022-02-26 NOTE — Progress Notes (Signed)
Established Patient Office Visit  Subjective:  Patient ID: Tara Gates, female    DOB: 1965/12/15  Age: 56 y.o. MRN: [id number]  CC:  Chief Complaint  Patient presents with   Weight Check    Can't get wegovy med, its on backorder until next year     HPI Tara Gates is a 56 y.o. female with past medical history of SVT, and obesity presents for f/u of  chronic medical conditions.  Obesity: She has been on Wegovy 0.5 mg weekly subcu injection.  She reports that the medication is on backorder and has been slowly regaining her weight.  Her weight today is 146 pounds.  Her weight on 01/26/2022 was 143.  She reports walking daily at work and at home, and has been unable to maintain her weight in the 130s.    Wt Readings from Last 3 Encounters:  02/26/22 146 lb (66.2 kg)  01/26/22 143 lb (64.9 kg)  01/18/22 142 lb (64.4 kg)     Past Medical History:  Diagnosis Date   Palpitations    PONV (postoperative nausea and vomiting)    PSVT (paroxysmal supraventricular tachycardia) (HCC)    ablation 2016   Vaginal Pap smear, abnormal     Past Surgical History:  Procedure Laterality Date   ELECTROPHYSIOLOGIC STUDY N/A 09/13/2014   Procedure: A-Flutter/A-Tach/Svt Ablation;  Surgeon: Marinus Maw, MD;  Location: [facility];  Service: Cardiovascular;  Laterality: N/A;   HYSTEROSCOPY WITH D & C N/A 01/18/2022   Procedure: DILATATION AND CURETTAGE /HYSTEROSCOPY;  Surgeon: Lazaro Arms, MD;  Location: [facility];  Service: Gynecology;  Laterality: N/A;   WISDOM TOOTH EXTRACTION      Family History  Problem Relation Age of Onset   Breast cancer Maternal Grandmother    Cancer Father    Lung cancer Father    Heart attack Mother 33   Cancer Brother        bladder    Social History   Socioeconomic History   Marital status: Married    Spouse name: Not on file   Number of children: 2   Years of education: Not on file   Highest education level: Not on file  Occupational  History   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  Tobacco Use   Smoking status: Never   Smokeless tobacco: Never  Vaping Use   Vaping Use: Never used  Substance and Sexual Activity   Alcohol use: No   Drug use: No   Sexual activity: Not Currently    Birth control/protection: None    Comment: bleeding now, started 12-28-21  Other Topics Concern   Not on file  Social History Narrative   Not on file   Social Determinants of Health   Financial Resource Strain: Low Risk  (12/17/2021)   Overall Financial Resource Strain (CARDIA)    Difficulty of Paying Living Expenses: Not very hard  Food Insecurity: No Food Insecurity (12/17/2021)   Hunger Vital Sign    Worried About Running Out of Food in the Last Year: Never true    Ran Out of Food in the Last Year: Never true  Transportation Needs: No Transportation Needs (12/17/2021)   PRAPARE - Administrator, Civil Service (Medical): No    Lack of Transportation (Non-Medical): No  Physical Activity: Insufficiently Active (12/17/2021)   Exercise Vital Sign    Days of Exercise per Week: 3 days    Minutes of Exercise per Session: 30 min  Stress: No  Stress Concern Present (12/17/2021)   [place]    Feeling of Stress : Only a little  Social Connections: Socially Integrated (12/17/2021)   Social Connection and Isolation Panel [NHANES]    Frequency of Communication with Friends and Family: More than three times a week    Frequency of Social Gatherings with Friends and Family: More than three times a week    Attends Religious Services: More than 4 times per year    Active Member of [employer] or Organizations: Yes    Attends Music therapist: More than 4 times per year    Marital Status: Married  Human resources officer Violence: Not At Risk (12/17/2021)   Humiliation, Afraid, Rape, and Kick questionnaire    Fear of Current or Ex-Partner: No    Emotionally Abused:  No    Physically Abused: No    Sexually Abused: No    Outpatient Medications Prior to Visit  Medication Sig Dispense Refill   medroxyPROGESTERone (PROVERA) 10 MG tablet Take 1 tablet (10 mg total) by mouth daily. As directed 30 tablet 11   naproxen sodium (ANAPROX) 220 MG tablet Take 220 mg by mouth 2 (two) times daily as needed (headache).      alclomethasone (ACLOVATE) 0.05 % cream Apply topically at bedtime. 60 g 11   HYDROcodone-acetaminophen (NORCO/VICODIN) 5-325 MG tablet Take 1 tablet by mouth every 6 (six) hours as needed. (Patient not taking: Reported on 02/26/2022) 10 tablet 0   Semaglutide-Weight Management (WEGOVY) 0.5 MG/0.5ML SOAJ Inject 0.5 mg into the skin once a week. (Patient not taking: Reported on 02/26/2022) 2 mL 3   ketorolac (TORADOL) 10 MG tablet Take 1 tablet (10 mg total) by mouth every 8 (eight) hours as needed. 15 tablet 0   No facility-administered medications prior to visit.    No Known Allergies  ROS Review of Systems  Constitutional:  Negative for fever.  Eyes:  Negative for photophobia and visual disturbance.  Respiratory:  Negative for chest tightness and shortness of breath.   Cardiovascular:  Negative for chest pain and palpitations.  Neurological:  Negative for dizziness and headaches.      Objective:    Physical Exam Cardiovascular:     Rate and Rhythm: Normal rate and regular rhythm.     Pulses: Normal pulses.     Heart sounds: Normal heart sounds.  Pulmonary:     Effort: Pulmonary effort is normal.     Breath sounds: Normal breath sounds.  Musculoskeletal:     Right lower leg: No edema.     Left lower leg: No edema.  Neurological:     Mental Status: She is alert.     Wt 146 lb (66.2 kg)   BMI 25.06 kg/m  Wt Readings from Last 3 Encounters:  02/26/22 146 lb (66.2 kg)  01/26/22 143 lb (64.9 kg)  01/18/22 142 lb (64.4 kg)    Lab Results  Component Value Date   TSH 3.610 01/26/2021   Lab Results  Component Value Date    WBC 6.8 01/18/2022   HGB 11.6 (L) 01/18/2022   HCT 36.4 01/18/2022   MCV 99.5 01/18/2022   PLT 215 01/18/2022   Lab Results  Component Value Date   NA 139 01/18/2022   K 3.6 01/18/2022   CO2 21 (L) 01/18/2022   GLUCOSE 79 01/18/2022   BUN 11 01/18/2022   CREATININE 0.88 01/18/2022   BILITOT 0.6 01/18/2022   ALKPHOS 73 01/18/2022   AST  17 01/18/2022   ALT 12 01/18/2022   PROT 6.9 01/18/2022   ALBUMIN 3.5 01/18/2022   CALCIUM 8.6 (L) 01/18/2022   ANIONGAP 8 01/18/2022   EGFR 71 07/28/2021   Lab Results  Component Value Date   CHOL 153 07/28/2021   Lab Results  Component Value Date   HDL 60 07/28/2021   Lab Results  Component Value Date   LDLCALC 83 07/28/2021   Lab Results  Component Value Date   TRIG 48 07/28/2021   Lab Results  Component Value Date   CHOLHDL 2.7 03/27/2018   Lab Results  Component Value Date   HGBA1C 5.0 03/27/2018      Assessment & Plan:   Problem List Items Addressed This Visit       Other   Obesity (BMI 30-39.9)    Encouraged the patient to continue on heart healthy diet and increasing her physical activities Encouraged the patient to inform me if she needs a refill of Wegovy Encouraged the patient that she is not a candidate for phentermine due to her history of SVT  Wt Readings from Last 3 Encounters:  02/26/22 146 lb (66.2 kg)  01/26/22 143 lb (64.9 kg)  01/18/22 142 lb (64.4 kg)         No orders of the defined types were placed in this encounter.   Follow-up: Return in about 4 months (around 06/29/2022).    Alvira Monday, FNP

## 2022-02-26 NOTE — Assessment & Plan Note (Addendum)
Encouraged the patient to continue on heart healthy diet and increasing her physical activities Encouraged the patient to inform me if she needs a refill of Wegovy Encouraged the patient that she is not a candidate for phentermine due to her history of SVT  Wt Readings from Last 3 Encounters:  02/26/22 146 lb (66.2 kg)  01/26/22 143 lb (64.9 kg)  01/18/22 142 lb (64.4 kg)

## 2022-04-07 ENCOUNTER — Encounter: Payer: Self-pay | Admitting: Family Medicine

## 2022-04-07 ENCOUNTER — Other Ambulatory Visit: Payer: Self-pay | Admitting: Family Medicine

## 2022-04-07 ENCOUNTER — Other Ambulatory Visit: Payer: Self-pay

## 2022-04-07 NOTE — Telephone Encounter (Signed)
Kindly refill wegovy

## 2022-04-08 ENCOUNTER — Other Ambulatory Visit: Payer: Self-pay

## 2022-04-08 MED ORDER — WEGOVY 0.5 MG/0.5ML ~~LOC~~ SOAJ: 0.5000 mg | SUBCUTANEOUS | 3 refills | Status: DC

## 2022-04-09 ENCOUNTER — Other Ambulatory Visit: Payer: Self-pay

## 2022-04-09 MED ORDER — WEGOVY 0.5 MG/0.5ML ~~LOC~~ SOAJ: 0.5000 mg | SUBCUTANEOUS | 0 refills | Status: DC

## 2022-05-12 ENCOUNTER — Encounter: Payer: Self-pay | Admitting: Obstetrics & Gynecology

## 2022-05-13 MED ORDER — PROGESTERONE 200 MG PO CAPS: ORAL_CAPSULE | ORAL | 11 refills | Status: DC

## 2022-05-31 MED ORDER — MEDROXYPROGESTERONE ACETATE 10 MG PO TABS: ORAL_TABLET | ORAL | 11 refills | Status: DC

## 2022-05-31 NOTE — Addendum Note (Signed)
Addended by: Florian Buff on: 05/31/2022 05:02 PM   Modules accepted: Orders

## 2022-07-02 ENCOUNTER — Ambulatory Visit: Payer: 59 | Admitting: Family Medicine

## 2022-07-13 ENCOUNTER — Encounter: Payer: Self-pay | Admitting: Family Medicine

## 2022-07-13 ENCOUNTER — Ambulatory Visit: Payer: 59 | Admitting: Family Medicine

## 2022-07-13 VITALS — BP 99/66 | HR 81 | Ht 64.0 in | Wt 149.1 lb

## 2022-07-13 DIAGNOSIS — E0789 Other specified disorders of thyroid: Secondary | ICD-10-CM

## 2022-07-13 DIAGNOSIS — E669 Obesity, unspecified: Secondary | ICD-10-CM | POA: Diagnosis not present

## 2022-07-13 DIAGNOSIS — E7849 Other hyperlipidemia: Secondary | ICD-10-CM

## 2022-07-13 DIAGNOSIS — E559 Vitamin D deficiency, unspecified: Secondary | ICD-10-CM

## 2022-07-13 DIAGNOSIS — R7301 Impaired fasting glucose: Secondary | ICD-10-CM

## 2022-07-13 DIAGNOSIS — R232 Flushing: Secondary | ICD-10-CM | POA: Diagnosis not present

## 2022-07-13 MED ORDER — WEGOVY 0.5 MG/0.5ML ~~LOC~~ SOAJ: 0.5000 mg | SUBCUTANEOUS | 2 refills | Status: DC

## 2022-07-13 NOTE — Progress Notes (Signed)
Established Patient Office Visit  Subjective:  Patient ID: Tara Gates, female    DOB: [birth year]  Age: 57 y.o. MRN: [id number]  CC:  Chief Complaint  Patient presents with   Follow-up    4 month f/u, pt would like refill for wegovy rx. Pt reports sx of hot flashes ongoing for about a month now (06/14/22).     HPI Tara Gates is a 57 y.o. female with past medical history of SVT, obesity, and PMB presents for f/u of  chronic medical conditions. For the details of today's visit, please refer to the assessment and plan.    Past Medical History:  Diagnosis Date   Palpitations    PONV (postoperative nausea and vomiting)    PSVT (paroxysmal supraventricular tachycardia)    ablation 2016   Vaginal Pap smear, abnormal     Past Surgical History:  Procedure Laterality Date   ELECTROPHYSIOLOGIC STUDY N/A 09/13/2014   Procedure: A-Flutter/A-Tach/Svt Ablation;  Surgeon: Evans Lance, MD;  Location: [facility];  Service: Cardiovascular;  Laterality: N/A;   HYSTEROSCOPY WITH D & C N/A 01/18/2022   Procedure: DILATATION AND CURETTAGE /HYSTEROSCOPY;  Surgeon: Florian Buff, MD;  Location: [facility];  Service: Gynecology;  Laterality: N/A;   WISDOM TOOTH EXTRACTION      Family History  Problem Relation Age of Onset   Breast cancer Maternal Grandmother    Cancer Father    Lung cancer Father    Heart attack Mother 61   Cancer Brother        bladder    Social History   Socioeconomic History   Marital status: Married    Spouse name: Not on file   Number of children: 2   Years of education: Not on file   Highest education level: Not on file  Occupational History   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  Tobacco Use   Smoking status: Never   Smokeless tobacco: Never  Vaping Use   Vaping Use: Never used  Substance and Sexual Activity   Alcohol use: No   Drug use: No   Sexual activity: Not Currently    Birth control/protection: None    Comment: bleeding  now, started 12-28-21  Other Topics Concern   Not on file  Social History Narrative   Not on file   Social Determinants of Health   Financial Resource Strain: Low Risk  (12/17/2021)   Overall Financial Resource Strain (CARDIA)    Difficulty of Paying Living Expenses: Not very hard  Food Insecurity: No Food Insecurity (12/17/2021)   Hunger Vital Sign    Worried About Running Out of Food in the Last Year: Never true    [place] in the Last Year: Never true  Transportation Needs: No Transportation Needs (12/17/2021)   PRAPARE - Hydrologist (Medical): No    Lack of Transportation (Non-Medical): No  Physical Activity: Insufficiently Active (12/17/2021)   Exercise Vital Sign    Days of Exercise per Week: 3 days    Minutes of Exercise per Session: 30 min  Stress: No Stress Concern Present (12/17/2021)   [place]    Feeling of Stress : Only a little  Social Connections: Socially Integrated (12/17/2021)   Social Connection and Isolation Panel [NHANES]    Frequency of Communication with Friends and Family: More than three times a week    Frequency of Social Gatherings with Friends and Family:  More than three times a week    Attends Religious Services: More than 4 times per year    Active Member of Clubs or Organizations: Yes    Attends Archivist Meetings: More than 4 times per year    Marital Status: Married  Human resources officer Violence: Not At Risk (12/17/2021)   Humiliation, Afraid, Rape, and Kick questionnaire    Fear of Current or Ex-Partner: No    Emotionally Abused: No    Physically Abused: No    Sexually Abused: No    Outpatient Medications Prior to Visit  Medication Sig Dispense Refill   alclomethasone (ACLOVATE) 0.05 % cream Apply topically at bedtime. 60 g 11   HYDROcodone-acetaminophen (NORCO/VICODIN) 5-325 MG tablet Take 1 tablet by mouth every 6 (six) hours as  needed. 10 tablet 0   medroxyPROGESTERone (PROVERA) 10 MG tablet 1 daily for 10 days 10 tablet 11   naproxen sodium (ANAPROX) 220 MG tablet Take 220 mg by mouth 2 (two) times daily as needed (headache).      Semaglutide-Weight Management (WEGOVY) 0.5 MG/0.5ML SOAJ Inject 0.5 mg into the skin once a week. 6 mL 0   No facility-administered medications prior to visit.    No Known Allergies  ROS Review of Systems  Constitutional:  Negative for chills and fever.  Eyes:  Negative for visual disturbance.  Respiratory:  Negative for chest tightness and shortness of breath.   Neurological:  Negative for dizziness and headaches.      Objective:    Physical Exam HENT:     Head: Normocephalic.     Mouth/Throat:     Mouth: Mucous membranes are moist.  Cardiovascular:     Rate and Rhythm: Normal rate.     Heart sounds: Normal heart sounds.  Pulmonary:     Effort: Pulmonary effort is normal.     Breath sounds: Normal breath sounds.  Neurological:     Mental Status: She is alert.     BP 99/66   Pulse 81   Ht '5\' 4"'$  (1.626 m)   Wt 149 lb 1.9 oz (67.6 kg)   SpO2 98%   BMI 25.60 kg/m  Wt Readings from Last 3 Encounters:  07/13/22 149 lb 1.9 oz (67.6 kg)  02/26/22 146 lb (66.2 kg)  01/26/22 143 lb (64.9 kg)    Lab Results  Component Value Date   TSH 3.610 01/26/2021   Lab Results  Component Value Date   WBC 6.8 01/18/2022   HGB 11.6 (L) 01/18/2022   HCT 36.4 01/18/2022   MCV 99.5 01/18/2022   PLT 215 01/18/2022   Lab Results  Component Value Date   NA 139 01/18/2022   K 3.6 01/18/2022   CO2 21 (L) 01/18/2022   GLUCOSE 79 01/18/2022   BUN 11 01/18/2022   CREATININE 0.88 01/18/2022   BILITOT 0.6 01/18/2022   ALKPHOS 73 01/18/2022   AST 17 01/18/2022   ALT 12 01/18/2022   PROT 6.9 01/18/2022   ALBUMIN 3.5 01/18/2022   CALCIUM 8.6 (L) 01/18/2022   ANIONGAP 8 01/18/2022   EGFR 71 07/28/2021   Lab Results  Component Value Date   CHOL 153 07/28/2021   Lab  Results  Component Value Date   HDL 60 07/28/2021   Lab Results  Component Value Date   LDLCALC 83 07/28/2021   Lab Results  Component Value Date   TRIG 48 07/28/2021   Lab Results  Component Value Date   CHOLHDL 2.7 03/27/2018   Lab Results  Component Value Date   HGBA1C 5.0 03/27/2018      Assessment & Plan:  Obesity (BMI 30-39.9) Assessment & Plan: Reports doing well on  Wegovy 0.5 mg Requested refill today Refill sent to the pharmacy No complaints voiced Wt Readings from Last 3 Encounters:  07/13/22 149 lb 1.9 oz (67.6 kg)  02/26/22 146 lb (66.2 kg)  01/26/22 143 lb (64.9 kg)     Orders: [id number]; Inject 0.5 mg into the skin once a week.  Dispense: 6 mL; Refill: 2  Hot flashes Assessment & Plan: Complains of hot flashes Patient declines pharmacological therapy and would like to be started on supplements instead Inform the patient that black cohosh is contraindicated given her history of SVT Encouraged the patient to follow up with her gynecologist for supplemental therapy for hot flashes    Other hyperlipidemia -     CMP14+EGFR -     CBC with Differential/Platelet -     Lipid panel  Impaired fasting blood sugar -     Hemoglobin A1c  Other specified disorders of thyroid -     TSH + free T4  Vitamin D deficiency -     VITAMIN D 25 Hydroxy (Vit-D Deficiency, Fractures)    Follow-up: Return in about 4 months (around 11/12/2022).   Alvira Monday, FNP

## 2022-07-13 NOTE — Assessment & Plan Note (Signed)
Reports doing well on  Wegovy 0.5 mg Requested refill today Refill sent to the pharmacy No complaints voiced Wt Readings from Last 3 Encounters:  07/13/22 149 lb 1.9 oz (67.6 kg)  02/26/22 146 lb (66.2 kg)  01/26/22 143 lb (64.9 kg)

## 2022-07-13 NOTE — Patient Instructions (Addendum)
I appreciate the opportunity to provide care to you today!    Follow up:  4 months  Labs: please stop by the lab today to get your blood drawn (CBC, CMP, TSH, Lipid profile, HgA1c, Vit D)  Please pick up your prescription at the pharmacy  Hot flashes I recommend following up with your gynecologist for supplement recommendations to help relieve your symptoms of hot flashes   Please continue to a heart-healthy diet and increase your physical activities. Try to exercise for 59mns at least five times a week.         It was a pleasure to see you and I look forward to continuing to work together on your health and well-being. Please do not hesitate to call the office if you need care or have questions about your care.   Have a wonderful day and week. With Gratitude, GAlvira MondayMSN, FNP-BC

## 2022-07-13 NOTE — Assessment & Plan Note (Addendum)
Complains of hot flashes Patient declines pharmacological therapy and would like to be started on supplements instead Inform the patient that black cohosh is contraindicated given her history of SVT Encouraged the patient to follow up with her gynecologist for supplemental therapy for hot flashes

## 2022-08-10 ENCOUNTER — Other Ambulatory Visit: Payer: Self-pay | Admitting: Family Medicine

## 2022-08-10 DIAGNOSIS — E038 Other specified hypothyroidism: Secondary | ICD-10-CM

## 2022-08-10 LAB — CBC WITH DIFFERENTIAL/PLATELET
Basophils Absolute: 0.1 10*3/uL (ref 0.0–0.2)
Basos: 1 %
EOS (ABSOLUTE): 0.1 10*3/uL (ref 0.0–0.4)
Eos: 2 %
Hematocrit: 38.8 % (ref 34.0–46.6)
Hemoglobin: 12.7 g/dL (ref 11.1–15.9)
Immature Grans (Abs): 0 10*3/uL (ref 0.0–0.1)
Immature Granulocytes: 0 %
Lymphocytes Absolute: 2.4 10*3/uL (ref 0.7–3.1)
Lymphs: 44 %
MCH: 30.5 pg (ref 26.6–33.0)
MCHC: 32.7 g/dL (ref 31.5–35.7)
MCV: 93 fL (ref 79–97)
Monocytes Absolute: 0.3 10*3/uL (ref 0.1–0.9)
Monocytes: 6 %
Neutrophils Absolute: 2.5 10*3/uL (ref 1.4–7.0)
Neutrophils: 47 %
Platelets: 193 10*3/uL (ref 150–450)
RBC: 4.16 x10E6/uL (ref 3.77–5.28)
RDW: 11.9 % (ref 11.7–15.4)
WBC: 5.4 10*3/uL (ref 3.4–10.8)

## 2022-08-10 LAB — CMP14+EGFR
ALT: 11 IU/L (ref 0–32)
AST: 15 IU/L (ref 0–40)
Albumin/Globulin Ratio: 1.9 (ref 1.2–2.2)
Albumin: 4.3 g/dL (ref 3.8–4.9)
Alkaline Phosphatase: 79 IU/L (ref 44–121)
BUN/Creatinine Ratio: 15 (ref 9–23)
BUN: 14 mg/dL (ref 6–24)
Bilirubin Total: 0.4 mg/dL (ref 0.0–1.2)
CO2: 23 mmol/L (ref 20–29)
Calcium: 8.9 mg/dL (ref 8.7–10.2)
Chloride: 107 mmol/L — ABNORMAL HIGH (ref 96–106)
Creatinine, Ser: 0.92 mg/dL (ref 0.57–1.00)
Globulin, Total: 2.3 g/dL (ref 1.5–4.5)
Glucose: 81 mg/dL (ref 70–99)
Potassium: 3.8 mmol/L (ref 3.5–5.2)
Sodium: 142 mmol/L (ref 134–144)
Total Protein: 6.6 g/dL (ref 6.0–8.5)
eGFR: 74 mL/min/{1.73_m2} (ref >59)

## 2022-08-10 LAB — LIPID PANEL
Chol/HDL Ratio: 2.6 ratio (ref 0.0–4.4)
Cholesterol, Total: 167 mg/dL (ref 100–199)
HDL: 65 mg/dL (ref >39)
LDL Chol Calc (NIH): 92 mg/dL (ref 0–99)
Triglycerides: 48 mg/dL (ref 0–149)
VLDL Cholesterol Cal: 10 mg/dL (ref 5–40)

## 2022-08-10 LAB — TSH+FREE T4
Free T4: 1.2 ng/dL (ref 0.82–1.77)
TSH: 5.37 u[IU]/mL — ABNORMAL HIGH (ref 0.450–4.500)

## 2022-08-10 LAB — VITAMIN D 25 HYDROXY (VIT D DEFICIENCY, FRACTURES): Vit D, 25-Hydroxy: 18.8 ng/mL — ABNORMAL LOW (ref 30.0–100.0)

## 2022-08-10 LAB — HEMOGLOBIN A1C
Est. average glucose Bld gHb Est-mCnc: 114 mg/dL
Hgb A1c MFr Bld: 5.6 % (ref 4.8–5.6)

## 2022-10-06 ENCOUNTER — Encounter: Payer: Self-pay | Admitting: Family Medicine

## 2022-10-06 LAB — TSH+FREE T4
Free T4: 1.09 ng/dL (ref 0.82–1.77)
TSH: 4.07 u[IU]/mL (ref 0.450–4.500)

## 2022-10-06 NOTE — Progress Notes (Signed)
Please inform the patient that her thyroid levels are stable

## 2022-10-07 ENCOUNTER — Other Ambulatory Visit: Payer: Self-pay

## 2022-10-07 DIAGNOSIS — E559 Vitamin D deficiency, unspecified: Secondary | ICD-10-CM

## 2022-10-08 LAB — SPECIMEN STATUS REPORT

## 2022-10-08 LAB — VITAMIN D 25 HYDROXY (VIT D DEFICIENCY, FRACTURES): Vit D, 25-Hydroxy: 28.7 ng/mL — ABNORMAL LOW (ref 30.0–100.0)

## 2022-10-09 ENCOUNTER — Other Ambulatory Visit: Payer: Self-pay | Admitting: Family Medicine

## 2022-10-09 DIAGNOSIS — E559 Vitamin D deficiency, unspecified: Secondary | ICD-10-CM

## 2022-10-09 MED ORDER — VITAMIN D (ERGOCALCIFEROL) 1.25 MG (50000 UNIT) PO CAPS: 50000.0000 [IU] | ORAL_CAPSULE | ORAL | 1 refills | Status: DC

## 2022-10-09 NOTE — Progress Notes (Signed)
Please encourage the patient take over-the-counter vitamin D 1000 IU daily, for vitamin D is slightly low.

## 2022-10-27 ENCOUNTER — Encounter: Payer: Self-pay | Admitting: Obstetrics & Gynecology

## 2022-11-16 ENCOUNTER — Encounter: Payer: Self-pay | Admitting: Family Medicine

## 2022-11-16 ENCOUNTER — Ambulatory Visit: Payer: 59 | Admitting: Family Medicine

## 2022-11-16 VITALS — BP 109/71 | HR 81 | Ht 64.0 in | Wt 156.0 lb

## 2022-11-16 DIAGNOSIS — E038 Other specified hypothyroidism: Secondary | ICD-10-CM | POA: Diagnosis not present

## 2022-11-16 DIAGNOSIS — R7301 Impaired fasting glucose: Secondary | ICD-10-CM | POA: Diagnosis not present

## 2022-11-16 DIAGNOSIS — E7849 Other hyperlipidemia: Secondary | ICD-10-CM

## 2022-11-16 DIAGNOSIS — E559 Vitamin D deficiency, unspecified: Secondary | ICD-10-CM

## 2022-11-16 NOTE — Patient Instructions (Addendum)

## 2022-11-16 NOTE — Assessment & Plan Note (Signed)
Encouraged to continue taking over-the-counter vitamin D at 1000 IU daily Will assess her vitamin D levels today Pending vit d

## 2022-11-16 NOTE — Progress Notes (Signed)
Established Patient Office Visit  Subjective:  Patient ID: Tara Gates, female    DOB: [birth year]  Age: 57 y.o. MRN: [id number]  CC:  Chief Complaint  Patient presents with   Chronic Care Management    4 month f/u    HPI Tara Gates is a 57 y.o. female with past medical history of vitamin D deficiency presents for f/u of  chronic medical conditions. For the details of today's visit, please refer to the assessment and plan.        Past Medical History:  Diagnosis Date   Palpitations    PONV (postoperative nausea and vomiting)    PSVT (paroxysmal supraventricular tachycardia)    ablation 2016   Vaginal Pap smear, abnormal     Past Surgical History:  Procedure Laterality Date   ELECTROPHYSIOLOGIC STUDY N/A 09/13/2014   Procedure: A-Flutter/A-Tach/Svt Ablation;  Surgeon: Marinus Maw, MD;  Location: [facility];  Service: Cardiovascular;  Laterality: N/A;   HYSTEROSCOPY WITH D & C N/A 01/18/2022   Procedure: DILATATION AND CURETTAGE /HYSTEROSCOPY;  Surgeon: Lazaro Arms, MD;  Location: [facility];  Service: Gynecology;  Laterality: N/A;   WISDOM TOOTH EXTRACTION      Family History  Problem Relation Age of Onset   Breast cancer Maternal Grandmother    Cancer Father    Lung cancer Father    Heart attack Mother 45   Cancer Brother        bladder    Social History   Socioeconomic History   Marital status: Married    Spouse name: Not on file   Number of children: 2   Years of education: Not on file   Highest education level: Not on file  Occupational History   Occupation: -Infection Prevention  Tobacco Use   Smoking status: Never   Smokeless tobacco: Never  Vaping Use   Vaping Use: Never used  Substance and Sexual Activity   Alcohol use: No   Drug use: No   Sexual activity: Not Currently    Birth control/protection: None    Comment: bleeding now, started 12-28-21  Other Topics Concern   Not on file  Social History Narrative   Not  on file   Social Determinants of Health   Financial Resource Strain: Low Risk  (12/17/2021)   Overall Financial Resource Strain (CARDIA)    Difficulty of Paying Living Expenses: Not very hard  Food Insecurity: No Food Insecurity (12/17/2021)   Hunger Vital Sign    Worried About Running Out of Food in the Last Year: Never true    Ran Out of Food in the Last Year: Never true  Transportation Needs: No Transportation Needs (12/17/2021)   PRAPARE - Administrator, Civil Service (Medical): No    Lack of Transportation (Non-Medical): No  Physical Activity: Insufficiently Active (12/17/2021)   Exercise Vital Sign    Days of Exercise per Week: 3 days    Minutes of Exercise per Session: 30 min  Stress: No Stress Concern Present (12/17/2021)   [employer] of Occupational Health - Occupational Stress Questionnaire    Feeling of Stress : Only a little  Social Connections: Socially Integrated (12/17/2021)   Social Connection and Isolation Panel [NHANES]    Frequency of Communication with Friends and Family: More than three times a week    Frequency of Social Gatherings with Friends and Family: More than three times a week    Attends Religious Services: More than 4 times  per year    Active Member of Clubs or Organizations: Yes    Attends Banker Meetings: More than 4 times per year    Marital Status: Married  Catering manager Violence: Not At Risk (12/17/2021)   Humiliation, Afraid, Rape, and Kick questionnaire    Fear of Current or Ex-Partner: No    Emotionally Abused: No    Physically Abused: No    Sexually Abused: No    Outpatient Medications Prior to Visit  Medication Sig Dispense Refill   HYDROcodone-acetaminophen (NORCO/VICODIN) 5-325 MG tablet Take 1 tablet by mouth every 6 (six) hours as needed. 10 tablet 0   medroxyPROGESTERone (PROVERA) 10 MG tablet 1 daily for 10 days 10 tablet 11   naproxen sodium (ANAPROX) 220 MG tablet Take 220 mg by mouth 2 (two) times  daily as needed (headache).      Semaglutide-Weight Management (WEGOVY) 0.5 MG/0.5ML SOAJ Inject 0.5 mg into the skin once a week. 6 mL 2   alclomethasone (ACLOVATE) 0.05 % cream Apply topically at bedtime. 60 g 11   No facility-administered medications prior to visit.    No Known Allergies  ROS Review of Systems  Constitutional:  Negative for chills and fever.  Eyes:  Negative for visual disturbance.  Respiratory:  Negative for chest tightness and shortness of breath.   Neurological:  Negative for dizziness and headaches.      Objective:    Physical Exam HENT:     Head: Normocephalic.     Mouth/Throat:     Mouth: Mucous membranes are moist.  Cardiovascular:     Rate and Rhythm: Normal rate.     Heart sounds: Normal heart sounds.  Pulmonary:     Effort: Pulmonary effort is normal.     Breath sounds: Normal breath sounds.  Neurological:     Mental Status: She is alert.     BP 109/71   Pulse 81   Ht 5\' 4"  (1.626 m)   Wt 156 lb 0.6 oz (70.8 kg)   SpO2 98%   BMI 26.78 kg/m  Wt Readings from Last 3 Encounters:  11/16/22 156 lb 0.6 oz (70.8 kg)  07/13/22 149 lb 1.9 oz (67.6 kg)  02/26/22 146 lb (66.2 kg)    Lab Results  Component Value Date   TSH 4.070 10/05/2022   Lab Results  Component Value Date   WBC 5.4 08/09/2022   HGB 12.7 08/09/2022   HCT 38.8 08/09/2022   MCV 93 08/09/2022   PLT 193 08/09/2022   Lab Results  Component Value Date   NA 142 08/09/2022   K 3.8 08/09/2022   CO2 23 08/09/2022   GLUCOSE 81 08/09/2022   BUN 14 08/09/2022   CREATININE 0.92 08/09/2022   BILITOT 0.4 08/09/2022   ALKPHOS 79 08/09/2022   AST 15 08/09/2022   ALT 11 08/09/2022   PROT 6.6 08/09/2022   ALBUMIN 4.3 08/09/2022   CALCIUM 8.9 08/09/2022   ANIONGAP 8 01/18/2022   EGFR 74 08/09/2022   Lab Results  Component Value Date   CHOL 167 08/09/2022   Lab Results  Component Value Date   HDL 65 08/09/2022   Lab Results  Component Value Date   LDLCALC 92  08/09/2022   Lab Results  Component Value Date   TRIG 48 08/09/2022   Lab Results  Component Value Date   CHOLHDL 2.6 08/09/2022   Lab Results  Component Value Date   HGBA1C 5.6 08/09/2022      Assessment & Plan:  Vitamin D deficiency Assessment & Plan: Encouraged to continue taking over-the-counter vitamin D at 1000 IU daily Will assess her vitamin D levels today Pending vit d  Orders: -     VITAMIN D 25 Hydroxy (Vit-D Deficiency, Fractures)  IFG (impaired fasting glucose) -     Hemoglobin A1c  Other specified hypothyroidism -     TSH + free T4  Other hyperlipidemia -     Lipid panel -     CMP14+EGFR -     CBC with Differential/Platelet    Follow-up: Return in about 4 months (around 03/19/2023).   Gilmore Laroche, FNP

## 2022-11-17 LAB — TSH+FREE T4
Free T4: 1.24 ng/dL (ref 0.82–1.77)
TSH: 3.38 u[IU]/mL (ref 0.450–4.500)

## 2022-11-17 LAB — CMP14+EGFR
ALT: 9 IU/L (ref 0–32)
AST: 13 IU/L (ref 0–40)
Albumin: 4.1 g/dL (ref 3.8–4.9)
Alkaline Phosphatase: 94 IU/L (ref 44–121)
BUN/Creatinine Ratio: 16 (ref 9–23)
BUN: 13 mg/dL (ref 6–24)
Bilirubin Total: 0.3 mg/dL (ref 0.0–1.2)
CO2: 23 mmol/L (ref 20–29)
Calcium: 8.8 mg/dL (ref 8.7–10.2)
Chloride: 104 mmol/L (ref 96–106)
Creatinine, Ser: 0.83 mg/dL (ref 0.57–1.00)
Globulin, Total: 2.5 g/dL (ref 1.5–4.5)
Glucose: 83 mg/dL (ref 70–99)
Potassium: 4 mmol/L (ref 3.5–5.2)
Sodium: 142 mmol/L (ref 134–144)
Total Protein: 6.6 g/dL (ref 6.0–8.5)
eGFR: 83 mL/min/{1.73_m2} (ref >59)

## 2022-11-17 LAB — CBC WITH DIFFERENTIAL/PLATELET
Basophils Absolute: 0 10*3/uL (ref 0.0–0.2)
Basos: 1 %
EOS (ABSOLUTE): 0.1 10*3/uL (ref 0.0–0.4)
Eos: 1 %
Hematocrit: 38.8 % (ref 34.0–46.6)
Hemoglobin: 12.9 g/dL (ref 11.1–15.9)
Immature Grans (Abs): 0 10*3/uL (ref 0.0–0.1)
Immature Granulocytes: 0 %
Lymphocytes Absolute: 1.9 10*3/uL (ref 0.7–3.1)
Lymphs: 36 %
MCH: 30.6 pg (ref 26.6–33.0)
MCHC: 33.2 g/dL (ref 31.5–35.7)
MCV: 92 fL (ref 79–97)
Monocytes Absolute: 0.4 10*3/uL (ref 0.1–0.9)
Monocytes: 7 %
Neutrophils Absolute: 2.9 10*3/uL (ref 1.4–7.0)
Neutrophils: 55 %
Platelets: 190 10*3/uL (ref 150–450)
RBC: 4.21 x10E6/uL (ref 3.77–5.28)
RDW: 11.5 % — ABNORMAL LOW (ref 11.7–15.4)
WBC: 5.2 10*3/uL (ref 3.4–10.8)

## 2022-11-17 LAB — HEMOGLOBIN A1C
Est. average glucose Bld gHb Est-mCnc: 105 mg/dL
Hgb A1c MFr Bld: 5.3 % (ref 4.8–5.6)

## 2022-11-17 LAB — LIPID PANEL
Chol/HDL Ratio: 2.5 ratio (ref 0.0–4.4)
Cholesterol, Total: 157 mg/dL (ref 100–199)
HDL: 63 mg/dL (ref >39)
LDL Chol Calc (NIH): 85 mg/dL (ref 0–99)
Triglycerides: 39 mg/dL (ref 0–149)
VLDL Cholesterol Cal: 9 mg/dL (ref 5–40)

## 2022-11-17 LAB — VITAMIN D 25 HYDROXY (VIT D DEFICIENCY, FRACTURES): Vit D, 25-Hydroxy: 37.3 ng/mL (ref 30.0–100.0)

## 2023-03-18 ENCOUNTER — Other Ambulatory Visit: Payer: Self-pay

## 2023-03-18 ENCOUNTER — Ambulatory Visit: Payer: 59 | Admitting: Family Medicine

## 2023-03-18 VITALS — BP 105/76 | HR 103 | Ht 64.0 in | Wt 158.0 lb

## 2023-03-18 DIAGNOSIS — Z6827 Body mass index (BMI) 27.0-27.9, adult: Secondary | ICD-10-CM

## 2023-03-18 DIAGNOSIS — R232 Flushing: Secondary | ICD-10-CM | POA: Diagnosis not present

## 2023-03-18 DIAGNOSIS — E669 Obesity, unspecified: Secondary | ICD-10-CM | POA: Diagnosis not present

## 2023-03-18 DIAGNOSIS — R7301 Impaired fasting glucose: Secondary | ICD-10-CM | POA: Diagnosis not present

## 2023-03-18 DIAGNOSIS — E559 Vitamin D deficiency, unspecified: Secondary | ICD-10-CM | POA: Diagnosis not present

## 2023-03-18 DIAGNOSIS — E7849 Other hyperlipidemia: Secondary | ICD-10-CM

## 2023-03-18 DIAGNOSIS — E038 Other specified hypothyroidism: Secondary | ICD-10-CM

## 2023-03-18 MED ORDER — WEGOVY 0.5 MG/0.5ML ~~LOC~~ SOAJ
0.5000 mg | SUBCUTANEOUS | 2 refills | Status: DC
  Filled 2023-03-18 (×2): qty 2, 28d supply, fill #0
  Filled 2023-04-11: qty 2, 28d supply, fill #1
  Filled 2023-05-06: qty 2, 28d supply, fill #2
  Filled 2023-06-08: qty 2, 28d supply, fill #3
  Filled 2023-07-23: qty 2, 28d supply, fill #4
  Filled 2023-10-22: qty 2, 28d supply, fill #5

## 2023-03-18 NOTE — Assessment & Plan Note (Signed)
The patient reports that she has been out of her Wegovy 0.25 mg weekly injection due to the medication being out of stock at her pharmacy. A prescription will be sent to [facility].  She was encouraged to implement lifestyle changes alongside pharmacological therapy for optimal weight loss results. Recommendations for weight management include:  Emphasize Lifestyle Changes: Focus on a heart-healthy diet and regular physical activity. Healthy Tips for Weight Loss:  Increase Intake of Nutrient-Rich Foods: Prioritize fruits, vegetables, and whole grains. Incorporate Lean Proteins: Include sources such as chicken, fish, beans, and legumes. Choose Low-Fat Dairy Products: Opt for low-fat options in dairy. Reduce Unhealthy Fats: Limit intake of saturated fats, trans fats, and cholesterol. Aim for Regular Physical Activity: Engage in at least 30 minutes of brisk walking or other physical activities on at least 5 days a week.

## 2023-03-18 NOTE — Patient Instructions (Signed)
I appreciate the opportunity to provide care to you today!    Follow up:  4 months  Labs: please stop by the lab during the week to get your blood drawn (CBC, CMP, TSH, Lipid profile, HgA1c, Vit D)  Recommendations for Weight Loss Management:  Emphasize Lifestyle Changes: A heart-healthy diet and increased physical activity are crucial. Healthy Tips for Weight Loss: Increase Intake of Nutrient-Rich Foods: Prioritize fruits, vegetables, and whole grains. Incorporate Lean Proteins: Include chicken, fish, beans, and legumes in your diet. Choose Low-Fat Dairy Products: Opt for dairy products that are low in fat. Reduce Unhealthy Fats: Limit saturated fats, trans fatty acids, and cholesterol. Aim for Regular Physical Activity: Engage in at least 30 minutes of brisk walking or other physical activities on at least 5 days a week.   Attached with your AVS, you will find valuable resources for self-education. I highly recommend dedicating some time to thoroughly examine them.   Please continue to a heart-healthy diet and increase your physical activities. Try to exercise for at least five days a week.    It was a pleasure to see you and I look forward to continuing to work together on your health and well-being. Please do not hesitate to call the office if you need care or have questions about your care.  In case of emergency, please visit the Emergency Department for urgent care, or contact our clinic at [phone] to schedule an appointment. We're here to help you!   Have a wonderful day and week. With Gratitude, Gilmore Laroche MSN, FNP-BC

## 2023-03-18 NOTE — Assessment & Plan Note (Addendum)
The patient is currently under the care of a gynecologist and receiving hormone therapy, with minimal relief of her vasomotor symptoms, including hot flashes. It was recommended that she discuss the option of Veozah, a nonhormonal therapy, with her provider.  Supportive care strategies were also suggested, including:  Dressing in layers to adjust for temperature changes Using fans or adjusting the environment as needed for comfort Considering cooling pillows or breathable bedding for nighttime relief She was encouraged to avoid triggers such as spicy foods, caffeine, and alcohol, which can exacerbate hot flash symptoms. Additionally, she was advised to stay well-hydrated, which can help with body temperature regulation.

## 2023-03-18 NOTE — Progress Notes (Signed)
Established Patient Office Visit  Subjective:  Patient ID: Tara Gates, female    DOB: 1965-06-13  Age: 57 y.o. MRN: [id number]  CC:  Chief Complaint  Patient presents with   Care Management    4 month f/u, having trouble finding wegovy medication.     HPI Tara Gates is a 57 y.o. female with past medical history of hot flashes, overweight presents for f/u of  chronic medical conditions. For the details of today's visit, please refer to the assessment and plan.     Past Medical History:  Diagnosis Date   Palpitations    PONV (postoperative nausea and vomiting)    PSVT (paroxysmal supraventricular tachycardia) (HCC)    ablation 2016   Vaginal Pap smear, abnormal     Past Surgical History:  Procedure Laterality Date   ELECTROPHYSIOLOGIC STUDY N/A 09/13/2014   Procedure: A-Flutter/A-Tach/Svt Ablation;  Surgeon: Marinus Maw, MD;  Location: [facility];  Service: Cardiovascular;  Laterality: N/A;   HYSTEROSCOPY WITH D & C N/A 01/18/2022   Procedure: DILATATION AND CURETTAGE /HYSTEROSCOPY;  Surgeon: Lazaro Arms, MD;  Location: [facility] OR;  Service: Gynecology;  Laterality: N/A;   WISDOM TOOTH EXTRACTION      Family History  Problem Relation Age of Onset   Breast cancer Maternal Grandmother    Cancer Father    Lung cancer Father    Heart attack Mother 35   Cancer Brother        bladder    Social History   Socioeconomic History   Marital status: Married    Spouse name: Not on file   Number of children: 2   Years of education: Not on file   Highest education level: Bachelor's degree (e.g., BA, AB, BS)  Occupational History   Occupation: [employer]  Tobacco Use   Smoking status: Never   Smokeless tobacco: Never  Vaping Use   Vaping status: Never Used  Substance and Sexual Activity   Alcohol use: No   Drug use: No   Sexual activity: Not Currently    Birth control/protection: None    Comment: bleeding now, started 12-28-21   Other Topics Concern   Not on file  Social History Narrative   Not on file   Social Determinants of Health   Financial Resource Strain: Low Risk  (03/18/2023)   Overall Financial Resource Strain (CARDIA)    Difficulty of Paying Living Expenses: Not hard at all  Food Insecurity: No Food Insecurity (03/18/2023)   Hunger Vital Sign    Worried About Running Out of Food in the Last Year: Never true    Ran Out of Food in the Last Year: Never true  Transportation Needs: No Transportation Needs (03/18/2023)   PRAPARE - Administrator, Civil Service (Medical): No    Lack of Transportation (Non-Medical): No  Physical Activity: Insufficiently Active (03/18/2023)   Exercise Vital Sign    Days of Exercise per Week: 2 days    Minutes of Exercise per Session: 30 min  Stress: Stress Concern Present (03/18/2023)   [employer] of Occupational Health - Occupational Stress Questionnaire    Feeling of Stress : Rather much  Social Connections: Socially Integrated (03/18/2023)   Social Connection and Isolation Panel [NHANES]    Frequency of Communication with Friends and Family: More than three times a week    Frequency of Social Gatherings with Friends and Family: More than three times a week    Attends Religious  Services: More than 4 times per year    Active Member of Clubs or Organizations: Yes    Attends Banker Meetings: More than 4 times per year    Marital Status: Married  Catering manager Violence: Not At Risk (12/17/2021)   Humiliation, Afraid, Rape, and Kick questionnaire    Fear of Current or Ex-Partner: No    Emotionally Abused: No    Physically Abused: No    Sexually Abused: No    Outpatient Medications Prior to Visit  Medication Sig Dispense Refill   HYDROcodone-acetaminophen (NORCO/VICODIN) 5-325 MG tablet Take 1 tablet by mouth every 6 (six) hours as needed. 10 tablet 0   medroxyPROGESTERone (PROVERA) 10 MG tablet 1 daily for 10 days 10 tablet 11    naproxen sodium (ANAPROX) 220 MG tablet Take 220 mg by mouth 2 (two) times daily as needed (headache).      Semaglutide-Weight Management (WEGOVY) 0.5 MG/0.5ML SOAJ Inject 0.5 mg into the skin once a week. (Patient not taking: Reported on 03/18/2023) 6 mL 2   No facility-administered medications prior to visit.    No Known Allergies  ROS Review of Systems  Constitutional:  Negative for chills and fever.  Eyes:  Negative for visual disturbance.  Respiratory:  Negative for chest tightness and shortness of breath.   Neurological:  Negative for dizziness and headaches.      Objective:    Physical Exam HENT:     Head: Normocephalic.     Mouth/Throat:     Mouth: Mucous membranes are moist.  Cardiovascular:     Rate and Rhythm: Normal rate.     Heart sounds: Normal heart sounds.  Pulmonary:     Effort: Pulmonary effort is normal.     Breath sounds: Normal breath sounds.  Neurological:     Mental Status: She is alert.     BP 105/76   Pulse (!) 103   Ht 5\' 4"  (1.626 m)   Wt 158 lb (71.7 kg)   SpO2 97%   BMI 27.12 kg/m  Wt Readings from Last 3 Encounters:  03/18/23 158 lb (71.7 kg)  11/16/22 156 lb 0.6 oz (70.8 kg)  07/13/22 149 lb 1.9 oz (67.6 kg)    Lab Results  Component Value Date   TSH 3.380 11/16/2022   Lab Results  Component Value Date   WBC 5.2 11/16/2022   HGB 12.9 11/16/2022   HCT 38.8 11/16/2022   MCV 92 11/16/2022   PLT 190 11/16/2022   Lab Results  Component Value Date   NA 142 11/16/2022   K 4.0 11/16/2022   CO2 23 11/16/2022   GLUCOSE 83 11/16/2022   BUN 13 11/16/2022   CREATININE 0.83 11/16/2022   BILITOT 0.3 11/16/2022   ALKPHOS 94 11/16/2022   AST 13 11/16/2022   ALT 9 11/16/2022   PROT 6.6 11/16/2022   ALBUMIN 4.1 11/16/2022   CALCIUM 8.8 11/16/2022   ANIONGAP 8 01/18/2022   EGFR 83 11/16/2022   Lab Results  Component Value Date   CHOL 157 11/16/2022   Lab Results  Component Value Date   HDL 63 11/16/2022   Lab Results   Component Value Date   LDLCALC 85 11/16/2022   Lab Results  Component Value Date   TRIG 39 11/16/2022   Lab Results  Component Value Date   CHOLHDL 2.5 11/16/2022   Lab Results  Component Value Date   HGBA1C 5.3 11/16/2022      Assessment & Plan:  Obesity (BMI 30-39.9)  Assessment & Plan: The patient reports that she has been out of her Wegovy 0.25 mg weekly injection due to the medication being out of stock at her pharmacy. A prescription will be sent to [facility].  She was encouraged to implement lifestyle changes alongside pharmacological therapy for optimal weight loss results. Recommendations for weight management include:  Emphasize Lifestyle Changes: Focus on a heart-healthy diet and regular physical activity. Healthy Tips for Weight Loss:  Increase Intake of Nutrient-Rich Foods: Prioritize fruits, vegetables, and whole grains. Incorporate Lean Proteins: Include sources such as chicken, fish, beans, and legumes. Choose Low-Fat Dairy Products: Opt for low-fat options in dairy. Reduce Unhealthy Fats: Limit intake of saturated fats, trans fats, and cholesterol. Aim for Regular Physical Activity: Engage in at least 30 minutes of brisk walking or other physical activities on at least 5 days a week.   Orders: -     Wegovy; Inject 0.5 mg into the skin once a week.  Dispense: 6 mL; Refill: 2  Hot flashes Assessment & Plan: The patient is currently under the care of a gynecologist and receiving hormone therapy, with minimal relief of her vasomotor symptoms, including hot flashes. It was recommended that she discuss the option of Veozah, a nonhormonal therapy, with her provider.  Supportive care strategies were also suggested, including:  Dressing in layers to adjust for temperature changes Using fans or adjusting the environment as needed for comfort Considering cooling pillows or breathable bedding for nighttime relief She was encouraged to avoid triggers such  as spicy foods, caffeine, and alcohol, which can exacerbate hot flash symptoms. Additionally, she was advised to stay well-hydrated, which can help with body temperature regulation.      IFG (impaired fasting glucose) -     Hemoglobin A1c  Vitamin D deficiency -     VITAMIN D 25 Hydroxy (Vit-D Deficiency, Fractures)  TSH (thyroid-stimulating hormone deficiency) -     TSH + free T4  Other hyperlipidemia -     Lipid panel -     CMP14+EGFR -     CBC with Differential/Platelet   Note: This chart has been completed using Engineer, civil (consulting) software, and while attempts have been made to ensure accuracy, certain words and phrases may not be transcribed as intended.   Follow-up: Return in about 4 months (around 07/16/2023).   Gilmore Laroche, FNP

## 2023-03-21 ENCOUNTER — Other Ambulatory Visit: Payer: Self-pay

## 2023-04-07 LAB — CBC WITH DIFFERENTIAL/PLATELET
Basophils Absolute: 0.1 10*3/uL (ref 0.0–0.2)
Basos: 1 %
EOS (ABSOLUTE): 0.1 10*3/uL (ref 0.0–0.4)
Eos: 2 %
Hematocrit: 38.4 % (ref 34.0–46.6)
Hemoglobin: 12.4 g/dL (ref 11.1–15.9)
Immature Grans (Abs): 0 10*3/uL (ref 0.0–0.1)
Immature Granulocytes: 0 %
Lymphocytes Absolute: 1.9 10*3/uL (ref 0.7–3.1)
Lymphs: 37 %
MCH: 30.8 pg (ref 26.6–33.0)
MCHC: 32.3 g/dL (ref 31.5–35.7)
MCV: 95 fL (ref 79–97)
Monocytes Absolute: 0.4 10*3/uL (ref 0.1–0.9)
Monocytes: 7 %
Neutrophils Absolute: 2.8 10*3/uL (ref 1.4–7.0)
Neutrophils: 53 %
Platelets: 243 10*3/uL (ref 150–450)
RBC: 4.03 x10E6/uL (ref 3.77–5.28)
RDW: 11.9 % (ref 11.7–15.4)
WBC: 5.2 10*3/uL (ref 3.4–10.8)

## 2023-04-07 LAB — TSH+FREE T4
Free T4: 1.36 ng/dL (ref 0.82–1.77)
TSH: 3.15 u[IU]/mL (ref 0.450–4.500)

## 2023-04-07 LAB — LIPID PANEL
Chol/HDL Ratio: 3 {ratio} (ref 0.0–4.4)
Cholesterol, Total: 175 mg/dL (ref 100–199)
HDL: 59 mg/dL (ref >39)
LDL Chol Calc (NIH): 106 mg/dL — ABNORMAL HIGH (ref 0–99)
Triglycerides: 47 mg/dL (ref 0–149)
VLDL Cholesterol Cal: 10 mg/dL (ref 5–40)

## 2023-04-07 LAB — HEMOGLOBIN A1C
Est. average glucose Bld gHb Est-mCnc: 111 mg/dL
Hgb A1c MFr Bld: 5.5 % (ref 4.8–5.6)

## 2023-04-07 LAB — CMP14+EGFR
ALT: 10 [IU]/L (ref 0–32)
AST: 19 [IU]/L (ref 0–40)
Albumin: 4 g/dL (ref 3.8–4.9)
Alkaline Phosphatase: 105 [IU]/L (ref 44–121)
BUN/Creatinine Ratio: 13 (ref 9–23)
BUN: 12 mg/dL (ref 6–24)
Bilirubin Total: 0.3 mg/dL (ref 0.0–1.2)
CO2: 24 mmol/L (ref 20–29)
Calcium: 9.1 mg/dL (ref 8.7–10.2)
Chloride: 105 mmol/L (ref 96–106)
Creatinine, Ser: 0.95 mg/dL (ref 0.57–1.00)
Globulin, Total: 2.6 g/dL (ref 1.5–4.5)
Glucose: 85 mg/dL (ref 70–99)
Potassium: 4 mmol/L (ref 3.5–5.2)
Sodium: 142 mmol/L (ref 134–144)
Total Protein: 6.6 g/dL (ref 6.0–8.5)
eGFR: 71 mL/min/{1.73_m2} (ref >59)

## 2023-04-07 LAB — VITAMIN D 25 HYDROXY (VIT D DEFICIENCY, FRACTURES): Vit D, 25-Hydroxy: 33.9 ng/mL (ref 30.0–100.0)

## 2023-04-07 NOTE — Progress Notes (Signed)
Please inform the patient that her lab results are stable. Encourage her to continue following a heart-healthy diet and to increase her physical activity as part of maintaining her overall health

## 2023-04-14 ENCOUNTER — Other Ambulatory Visit: Payer: Self-pay

## 2023-05-10 ENCOUNTER — Other Ambulatory Visit: Payer: Self-pay

## 2023-06-09 ENCOUNTER — Other Ambulatory Visit: Payer: Self-pay

## 2023-07-05 ENCOUNTER — Telehealth: Payer: 59 | Admitting: Nurse Practitioner

## 2023-07-05 ENCOUNTER — Other Ambulatory Visit: Payer: Self-pay | Admitting: Nurse Practitioner

## 2023-07-05 DIAGNOSIS — R6889 Other general symptoms and signs: Secondary | ICD-10-CM

## 2023-07-05 MED ORDER — OSELTAMIVIR PHOSPHATE 75 MG PO CAPS: 75.0000 mg | ORAL_CAPSULE | Freq: Two times a day (BID) | ORAL | 0 refills | Status: AC

## 2023-07-05 NOTE — Progress Notes (Signed)

## 2023-07-18 ENCOUNTER — Ambulatory Visit: Payer: 59 | Admitting: Family Medicine

## 2023-07-28 ENCOUNTER — Other Ambulatory Visit: Payer: Self-pay

## 2023-10-28 ENCOUNTER — Other Ambulatory Visit: Payer: Self-pay

## 2023-11-18 ENCOUNTER — Ambulatory Visit: Payer: Self-pay

## 2023-11-18 ENCOUNTER — Encounter: Payer: Self-pay | Admitting: Family Medicine

## 2023-11-18 ENCOUNTER — Telehealth: Payer: Self-pay | Admitting: Family Medicine

## 2023-11-18 ENCOUNTER — Telehealth (INDEPENDENT_AMBULATORY_CARE_PROVIDER_SITE_OTHER): Admitting: Family Medicine

## 2023-11-18 VITALS — Ht 64.0 in | Wt 160.0 lb

## 2023-11-18 DIAGNOSIS — S80861A Insect bite (nonvenomous), right lower leg, initial encounter: Secondary | ICD-10-CM | POA: Diagnosis not present

## 2023-11-18 DIAGNOSIS — A6920 Lyme disease, unspecified: Secondary | ICD-10-CM | POA: Diagnosis not present

## 2023-11-18 DIAGNOSIS — W57XXXA Bitten or stung by nonvenomous insect and other nonvenomous arthropods, initial encounter: Secondary | ICD-10-CM

## 2023-11-18 MED ORDER — DOXYCYCLINE HYCLATE 100 MG PO TABS: 100.0000 mg | ORAL_TABLET | Freq: Two times a day (BID) | ORAL | 0 refills | Status: AC

## 2023-11-18 NOTE — Telephone Encounter (Signed)
 FYI Only or Action Required?: FYI only for provider.  Patient was last seen in primary care on 03/18/2023 by Zarwolo, Gloria, FNP.  Called Nurse Triage reporting Tick Removal.No  Symptoms began several days ago.  Interventions attempted: Rest, hydration, or home remedies.  Symptoms are: gradually worsening.  Triage Disposition: See Physician Within 24 Hours  Patient/caregiver understands and will follow disposition?: Yes   Copied from CRM [id number]. Topic: Clinical - Red Word Triage >> Nov 18, 2023  8:47 AM Harlene ORN wrote: Red Word that prompted transfer to Nurse Triage: removed a tick a few day ago no headaches or fever but the joints in her right leg is hurting would like to be prescribed of doxycicline Reason for Disposition  Red ring or bull's-eye rash occurs at tick bite  Answer Assessment - Initial Assessment Questions 1. ATTACHED:  Is the tick still on the skin?  (e.g., yes, no, unsure)     NO  3. ONSET - TICK NOT STILL ATTACHED: If the tick has been removed, how long do you think the tick was attached before you removed it? (e.g., 5 hours, 2 days). When was this?     Monday, unsure of how long it was attached before removal  4. LOCATION: Where is the tick bite located? (e.g., arm, leg)     Medial Side of the Right leg at the knee  5. TYPE of TICK: Is it a wood tick or a deer tick? (e.g., deer tick, wood tick; unsure)     Deer Tick  6. SIZE of TICK: How big is the tick? (e.g., size of poppy seed, apple seed, watermelon seed; unsure) Note: Deer ticks can be the size of a poppy seed (nymph) or an apple seed (adult).       Small  7. ENGORGED: Did the tick look flat or engorged (full, swollen)? (e.g., flat, engorged; unsure)     Unsure  8. OTHER SYMPTOMS: Do you have any other symptoms? (e.g., fever, rash, redness at bite area, red ring around bite)     Bullseye like appearance, bruising around the area, Knee Pain  9. PREGNANCY: Is there any chance  you are pregnant? When was your last menstrual period?     No  Protocols used: Tick Bite-A-AH

## 2023-11-18 NOTE — Telephone Encounter (Signed)
 Copied from CRM [id number]. Topic: Appointments - Appointment Info/Confirmation >> Nov 18, 2023  1:08 PM Winona R wrote: Pt was calling to verify if office needed to resch her virtual call to a earlier time, while on hold for Cal pt disconnected from the line. Please follow up if earlier time is needed.

## 2023-11-18 NOTE — Telephone Encounter (Signed)
 Appt scheduled for Mychart video , patient aware  Patient unable to come in office for visit due to work and work meetings

## 2023-11-18 NOTE — Progress Notes (Signed)
 Virtual Visit via Video Note  I connected with Tara Gates on 11/18/23 at  1:20 PM EDT by a video enabled telemedicine application and verified that I am speaking with the correct person using two identifiers.  Patient Location: Home Provider Location: Office/Clinic  I discussed the limitations, risks, security, and privacy concerns of performing an evaluation and management service by video and the availability of in person appointments. I also discussed with the patient that there may be a patient responsible charge related to this service. The patient expressed understanding and agreed to proceed.  Subjective: PCP: Zarwolo, Gloria, FNP  Chief Complaint  Patient presents with   Insect Bite    This happen on Monday tick was remove , small red shot , no fever , stated that it had gotten small in size, some itching at first    The patient reports a tick bite located on the right leg, behind the knee. The tick was attached for more than 24 hours before being removed. Since then, the patient has experienced localized itching, aching, and soreness at the site. She reports the presence of a rash behind the knee that resembles a bull's-eye pattern, although it was difficult to clearly visualize during the video visit. She denies any fever or chills. The patient rates her leg pain as 6 out of 10 and states that it hurts to bend the leg, making it difficult for her to walk.     ROS: Per HPI  Current Outpatient Medications:    doxycycline  (VIBRA -TABS) 100 MG tablet, Take 1 tablet (100 mg total) by mouth 2 (two) times daily for 10 days., Disp: 20 tablet, Rfl: 0   naproxen sodium (ANAPROX) 220 MG tablet, Take 220 mg by mouth 2 (two) times daily as needed (headache). , Disp: , Rfl:    Semaglutide -Weight Management (WEGOVY ) 0.5 MG/0.5ML SOAJ, Inject 0.5 mg into the skin once a week., Disp: 6 mL, Rfl: 2   HYDROcodone -acetaminophen  (NORCO/VICODIN) 5-325 MG tablet, Take 1 tablet by mouth every 6  (six) hours as needed. (Patient not taking: Reported on 11/18/2023), Disp: 10 tablet, Rfl: 0   medroxyPROGESTERone  (PROVERA ) 10 MG tablet, 1 daily for 10 days (Patient not taking: Reported on 11/18/2023), Disp: 10 tablet, Rfl: 11  Observations/Objective: Today's Vitals   11/18/23 1304  Weight: 160 lb (72.6 kg)  Height: 5' 4 (1.626 m)   Physical Exam Patient is alert and no acute distress noted.   Assessment and Plan: Lyme disease -     Doxycycline  Hyclate; Take 1 tablet (100 mg total) by mouth 2 (two) times daily for 10 days.  Dispense: 20 tablet; Refill: 0 -     Lyme Disease Serology w/Reflex  Tick bite of right lower leg, initial encounter Assessment & Plan: Start Doxycyline 100 mg twice daily x 10 days  Advise to  keep the area clean and monitor the rash for changes. Rest the leg, apply a cool compress for discomfort, and avoid strenuous activity. If symptoms worsen or persist, follow up in person for further evaluation 4-6 week follow up  for labs      Follow Up Instructions: No follow-ups on file.   I discussed the assessment and treatment plan with the patient. The patient was provided an opportunity to ask questions, and all were answered. The patient agreed with the plan and demonstrated an understanding of the instructions.   The patient was advised to call back or seek an in-person evaluation if the symptoms worsen or if the  condition fails to improve as anticipated.  The above assessment and management plan was discussed with the patient. The patient verbalized understanding of and has agreed to the management plan.   Hilario Kidd Wilhelmena Falter, FNP

## 2023-11-18 NOTE — Assessment & Plan Note (Addendum)
 Start Doxycyline 100 mg twice daily x 10 days  Advise to  keep the area clean and monitor the rash for changes. Rest the leg, apply a cool compress for discomfort, and avoid strenuous activity. If symptoms worsen or persist, follow up in person for further evaluation 4-6 week follow up  for labs

## 2023-11-21 NOTE — Telephone Encounter (Signed)
 Patient had video visit with iliana on Friday 7/11

## 2023-11-29 ENCOUNTER — Other Ambulatory Visit (HOSPITAL_COMMUNITY)
Admission: RE | Admit: 2023-11-29 | Discharge: 2023-11-29 | Disposition: A | Discharge status: Home / Self Care | Source: Ambulatory Visit | Attending: Obstetrics & Gynecology | Admitting: Obstetrics & Gynecology

## 2023-11-29 ENCOUNTER — Ambulatory Visit: Admitting: Obstetrics & Gynecology

## 2023-11-29 ENCOUNTER — Encounter: Payer: Self-pay | Admitting: Obstetrics & Gynecology

## 2023-11-29 VITALS — BP 113/70 | HR 80 | Ht 64.0 in | Wt 163.0 lb

## 2023-11-29 DIAGNOSIS — Z1231 Encounter for screening mammogram for malignant neoplasm of breast: Secondary | ICD-10-CM | POA: Diagnosis not present

## 2023-11-29 DIAGNOSIS — Z1211 Encounter for screening for malignant neoplasm of colon: Secondary | ICD-10-CM | POA: Diagnosis not present

## 2023-11-29 DIAGNOSIS — Z01419 Encounter for gynecological examination (general) (routine) without abnormal findings: Secondary | ICD-10-CM | POA: Insufficient documentation

## 2023-11-29 DIAGNOSIS — N951 Menopausal and female climacteric states: Secondary | ICD-10-CM | POA: Diagnosis not present

## 2023-11-29 DIAGNOSIS — Z1212 Encounter for screening for malignant neoplasm of rectum: Secondary | ICD-10-CM | POA: Diagnosis not present

## 2023-11-29 LAB — HEMOCCULT GUIAC POC 1CARD (OFFICE): Card #1 Date: NEGATIVE

## 2023-11-29 MED ORDER — CONJ ESTROGENS-BAZEDOXIFENE 0.45-20 MG PO TABS: 1.0000 | ORAL_TABLET | Freq: Every day | ORAL | 11 refills | Status: DC

## 2023-11-29 NOTE — Addendum Note (Signed)
 Addended by: NEYSA CLARITA RAMAN on: 11/29/2023 09:40 AM   Modules accepted: Orders

## 2023-11-29 NOTE — Progress Notes (Signed)
 Subjective:     Tara Gates is a 58 y.o. female here for a routine exam.  Patient's last menstrual period was 12/28/2021 (exact date). [id number] Birth Control Method:  na Menstrual Calendar(currently): amenorrhea  Current complaints: increased vasomotor symptoms.   Current acute medical issues:  none   Recent Gynecologic History Patient's last menstrual period was 12/28/2021 (exact date). Last Pap: 2023,  LSIL neg HPV Last mammogram: 11/2021,  normal  Past Medical History:  Diagnosis Date   Palpitations    PONV (postoperative nausea and vomiting)    PSVT (paroxysmal supraventricular tachycardia) (HCC)    ablation 2016   Vaginal Pap smear, abnormal     Past Surgical History:  Procedure Laterality Date   ELECTROPHYSIOLOGIC STUDY N/A 09/13/2014   Procedure: A-Flutter/A-Tach/Svt Ablation;  Surgeon: Danelle LELON [place], MD;  Location: [facility];  Service: Cardiovascular;  Laterality: N/A;   HYSTEROSCOPY WITH D & C N/A 01/18/2022   Procedure: DILATATION AND CURETTAGE /HYSTEROSCOPY;  Surgeon: Jayne Vonn DEL, MD;  Location: [facility] OR;  Service: Gynecology;  Laterality: N/A;   WISDOM TOOTH EXTRACTION      OB History     Gravida  3   Para  2   Term  2   Preterm      AB  1   Living  2      SAB  1   IAB      Ectopic      Multiple      Live Births  2           Social History   Socioeconomic History   Marital status: Married    Spouse name: Not on file   Number of children: 2   Years of education: Not on file   Highest education level: Bachelor's degree (e.g., BA, AB, BS)  Occupational History   Occupation: [employer]  Tobacco Use   Smoking status: Never   Smokeless tobacco: Never  Vaping Use   Vaping status: Never Used  Substance and Sexual Activity   Alcohol use: No   Drug use: No   Sexual activity: Yes    Birth control/protection: None    Comment: bleeding now, started 12-28-21  Other Topics Concern   Not on file   Social History Narrative   Not on file   Social Drivers of Health   Financial Resource Strain: Patient Declined (11/29/2023)   Overall Financial Resource Strain (CARDIA)    Difficulty of Paying Living Expenses: Patient declined  Food Insecurity: No Food Insecurity (11/29/2023)   Hunger Vital Sign    Worried About Running Out of Food in the Last Year: Never true    Ran Out of Food in the Last Year: Never true  Transportation Needs: Patient Declined (11/29/2023)   PRAPARE - Transportation    Lack of Transportation (Medical): Patient declined    Lack of Transportation (Non-Medical): Patient declined  Physical Activity: Insufficiently Active (11/29/2023)   Exercise Vital Sign    Days of Exercise per Week: 2 days    Minutes of Exercise per Session: 10 min  Stress: Stress Concern Present (11/29/2023)   [employer] of Occupational Health - Occupational Stress Questionnaire    Feeling of Stress: To some extent  Social Connections: Unknown (11/29/2023)   Social Connection and Isolation Panel    Frequency of Communication with Friends and Family: More than three times a week    Frequency of Social Gatherings with Friends and Family: More than three times a  week    Attends Religious Services: More than 4 times per year    Active Member of Clubs or Organizations: Patient declined    Attends Banker Meetings: Patient declined    Marital Status: Married    Family History  Problem Relation Age of Onset   Breast cancer Maternal Grandmother    Cancer Father    Lung cancer Father    Heart attack Mother 50   Cancer Brother        bladder     Current Outpatient Medications:    Conj Estrogens -Bazedoxifene 0.45-20 MG TABS, Take 1 tablet by mouth daily. Take 1 tablet daily, Disp: 30 tablet, Rfl: 11   HYDROcodone -acetaminophen  (NORCO/VICODIN) 5-325 MG tablet, Take 1 tablet by mouth every 6 (six) hours as needed. (Patient not taking: Reported on 11/18/2023), Disp: 10 tablet, Rfl:  0   medroxyPROGESTERone  (PROVERA ) 10 MG tablet, 1 daily for 10 days (Patient not taking: Reported on 11/18/2023), Disp: 10 tablet, Rfl: 11   naproxen sodium (ANAPROX) 220 MG tablet, Take 220 mg by mouth 2 (two) times daily as needed (headache). , Disp: , Rfl:    Semaglutide -Weight Management (WEGOVY ) 0.5 MG/0.5ML SOAJ, Inject 0.5 mg into the skin once a week., Disp: 6 mL, Rfl: 2  Review of Systems  Review of Systems  Constitutional: Negative for fever, chills, weight loss, malaise/fatigue and diaphoresis.  HENT: Negative for hearing loss, ear pain, nosebleeds, congestion, sore throat, neck pain, tinnitus and ear discharge.   Eyes: Negative for blurred vision, double vision, photophobia, pain, discharge and redness.  Respiratory: Negative for cough, hemoptysis, sputum production, shortness of breath, wheezing and stridor.   Cardiovascular: Negative for chest pain, palpitations, orthopnea, claudication, leg swelling and PND.  Gastrointestinal: negative for abdominal pain. Negative for heartburn, nausea, vomiting, diarrhea, constipation, blood in stool and melena.  Genitourinary: Negative for dysuria, urgency, frequency, hematuria and flank pain.  Musculoskeletal: Negative for myalgias, back pain, joint pain and falls.  Skin: Negative for itching and rash.  Neurological: Negative for dizziness, tingling, tremors, sensory change, speech change, focal weakness, seizures, loss of consciousness, weakness and headaches.  Endo/Heme/Allergies: Negative for environmental allergies and polydipsia. Does not bruise/bleed easily.  Psychiatric/Behavioral: Negative for depression, suicidal ideas, hallucinations, memory loss and substance abuse. The patient is not nervous/anxious and does not have insomnia.        Objective:  Blood pressure 113/70, pulse 80, height 5' 4 (1.626 m), weight 163 lb (73.9 kg), last menstrual period 12/28/2021.   Physical Exam  Vitals reviewed. Constitutional: She is oriented to  person, place, and time. She appears well-developed and well-nourished.  HENT:  Head: Normocephalic and atraumatic.        Right Ear: External ear normal.  Left Ear: External ear normal.  Nose: Nose normal.  Mouth/Throat: Oropharynx is clear and moist.  Eyes: Conjunctivae and EOM are normal. Pupils are equal, round, and reactive to light. Right eye exhibits no discharge. Left eye exhibits no discharge. No scleral icterus.  Neck: Normal range of motion. Neck supple. No tracheal deviation present. No thyromegaly present.  Cardiovascular: Normal rate, regular rhythm, normal heart sounds and intact distal pulses.  Exam reveals no gallop and no friction rub.   No murmur heard. Respiratory: Effort normal and breath sounds normal. No respiratory distress. She has no wheezes. She has no rales. She exhibits no tenderness.  GI: Soft. Bowel sounds are normal. She exhibits no distension and no mass. There is no tenderness. There is no rebound and no  guarding.  Genitourinary:  Breasts no masses skin changes or nipple changes bilaterally      Vulva is normal without lesions Vagina is pink moist without discharge Cervix normal in appearance and pap is done Uterus is normal size shape and contour Adnexa is negative with normal sized ovaries  {Rectal    hemoccult negative, normal tone, no masses  Musculoskeletal: Normal range of motion. She exhibits no edema and no tenderness.  Neurological: She is alert and oriented to person, place, and time. She has normal reflexes. She displays normal reflexes. No cranial nerve deficit. She exhibits normal muscle tone. Coordination normal.  Skin: Skin is warm and dry. No rash noted. No erythema. No pallor.  Psychiatric: She has a normal mood and affect. Her behavior is normal. Judgment and thought content normal.       Medications Ordered at today's visit: Meds ordered this encounter  Medications   Conj Estrogens -Bazedoxifene 0.45-20 MG TABS    Sig: Take 1 tablet  by mouth daily. Take 1 tablet daily    Dispense:  30 tablet    Refill:  11    Other orders placed at today's visit: Orders Placed This Encounter  Procedures   MM 3D SCREENING MAMMOGRAM BILATERAL BREAST     ASSESSMENT + PLAN:    ICD-10-CM   1. Well woman exam with routine gynecological exam  Z01.419     2. Encounter for gynecological examination with Papanicolaou smear of cervix  Z01.419 Cytology - PAP( [place])    3. Vasomotor symptoms due to menopause  N95.1     4. Breast cancer screening by mammogram  Z12.31 MM 3D SCREENING MAMMOGRAM BILATERAL BREAST          No follow-ups on file.

## 2023-12-06 LAB — CYTOLOGY - PAP
Comment: NEGATIVE
Diagnosis: UNDETERMINED — AB
High risk HPV: NEGATIVE

## 2023-12-07 ENCOUNTER — Ambulatory Visit: Payer: Self-pay | Admitting: Obstetrics & Gynecology

## 2023-12-07 ENCOUNTER — Telehealth: Payer: Self-pay | Admitting: *Deleted

## 2023-12-07 ENCOUNTER — Ambulatory Visit (HOSPITAL_COMMUNITY)
Admission: RE | Admit: 2023-12-07 | Discharge: 2023-12-07 | Disposition: A | Discharge status: Home / Self Care | Source: Ambulatory Visit | Attending: Obstetrics & Gynecology | Admitting: Obstetrics & Gynecology

## 2023-12-07 DIAGNOSIS — Z1231 Encounter for screening mammogram for malignant neoplasm of breast: Secondary | ICD-10-CM | POA: Insufficient documentation

## 2023-12-07 NOTE — Telephone Encounter (Signed)
 Insurance has denied Duavee .  Alternatives listed are estradiol, estradiol-norethindrone, Bijuva, Combipatch, Imvexxy, Vagifem. Please send in new script if able.

## 2023-12-10 ENCOUNTER — Other Ambulatory Visit: Payer: Self-pay | Admitting: Obstetrics & Gynecology

## 2023-12-10 MED ORDER — COMBIPATCH 0.05-0.14 MG/DAY TD PTTW: 1.0000 | MEDICATED_PATCH | TRANSDERMAL | 12 refills | Status: DC

## 2023-12-21 LAB — LYME DISEASE SEROLOGY W/REFLEX: Lyme Total Antibody EIA: NEGATIVE

## 2024-03-21 ENCOUNTER — Other Ambulatory Visit: Payer: Self-pay | Admitting: Family Medicine

## 2024-03-21 DIAGNOSIS — E669 Obesity, unspecified: Secondary | ICD-10-CM

## 2024-03-23 ENCOUNTER — Other Ambulatory Visit: Payer: Self-pay | Admitting: Family Medicine

## 2024-03-23 DIAGNOSIS — E669 Obesity, unspecified: Secondary | ICD-10-CM

## 2024-04-10 ENCOUNTER — Other Ambulatory Visit: Payer: Self-pay

## 2024-05-11 ENCOUNTER — Encounter: Payer: Self-pay | Admitting: Obstetrics & Gynecology

## 2024-05-18 MED ORDER — COMBIPATCH 0.05-0.25 MG/DAY TD PTTW: 1.0000 | MEDICATED_PATCH | TRANSDERMAL | 12 refills | Status: DC

## 2024-05-24 MED ORDER — ESTRADIOL 2 MG PO TABS: 2.0000 mg | ORAL_TABLET | Freq: Every day | ORAL | 11 refills | Status: AC

## 2024-05-24 NOTE — Addendum Note (Signed)
 Addended by: JAYNE VONN DEL on: 05/24/2024 12:22 PM   Modules accepted: Orders

## 2024-06-01 ENCOUNTER — Encounter: Payer: Self-pay | Admitting: Family Medicine

## 2024-06-01 ENCOUNTER — Other Ambulatory Visit: Payer: Self-pay

## 2024-06-01 ENCOUNTER — Telehealth: Admitting: Family Medicine

## 2024-06-01 VITALS — Wt 172.4 lb

## 2024-06-01 DIAGNOSIS — E559 Vitamin D deficiency, unspecified: Secondary | ICD-10-CM

## 2024-06-01 DIAGNOSIS — E038 Other specified hypothyroidism: Secondary | ICD-10-CM

## 2024-06-01 DIAGNOSIS — M7918 Myalgia, other site: Secondary | ICD-10-CM | POA: Diagnosis not present

## 2024-06-01 DIAGNOSIS — Z7985 Long-term (current) use of injectable non-insulin antidiabetic drugs: Secondary | ICD-10-CM

## 2024-06-01 DIAGNOSIS — E7849 Other hyperlipidemia: Secondary | ICD-10-CM

## 2024-06-01 DIAGNOSIS — R7301 Impaired fasting glucose: Secondary | ICD-10-CM

## 2024-06-01 DIAGNOSIS — E669 Obesity, unspecified: Secondary | ICD-10-CM | POA: Diagnosis not present

## 2024-06-01 MED ORDER — CYCLOBENZAPRINE HCL 5 MG PO TABS
5.0000 mg | ORAL_TABLET | Freq: Every evening | ORAL | 1 refills | Status: AC | PRN
  Filled 2024-06-01: qty 30, 30d supply, fill #0

## 2024-06-01 MED ORDER — WEGOVY 0.5 MG/0.5ML ~~LOC~~ SOAJ
0.5000 mg | SUBCUTANEOUS | 2 refills | Status: AC
  Filled 2024-06-01: qty 6, 84d supply, fill #0

## 2024-06-01 NOTE — Progress Notes (Signed)
 "  Virtual Visit via Video Note  I connected with Tara Gates on 06/03/24 at  9:00 AM EST by a video enabled telemedicine application and verified that I am speaking with the correct person using two identifiers.  Patient Location: Home Provider Location: Home Office  I discussed the limitations, risks, security, and privacy concerns of performing an evaluation and management service by video and the availability of in person appointments. I also discussed with the patient that there may be a patient responsible charge related to this service. The patient expressed understanding and agreed to proceed.  Subjective: PCP: Tara Meade PEDLAR, FNP  Chief Complaint  Patient presents with   Medical Management of Chronic Issues    Requesting wegovy  or mounjaro for weight control    HPI  The patient is requesting to be restarted on a weight loss medication, noting that her current weight is 172.4 lbs. She reports musculoskeletal joint pain involving the hips, knees, and back, with no recent injury or trauma, and states the pain has been present for the past 6 months.  The patient also endorses sleep disturbances.  ROS: Per HPI Current Medications[1]  Observations/Objective: Today's Vitals   06/03/24 2152  Weight: 172 lb 6.4 oz (78.2 kg)   Physical Exam Patient is well-developed, well-nourished in no acute distress.  Resting comfortably at home.  Head is normocephalic, atraumatic.  No labored breathing.  Speech is clear and coherent with logical content.  Patient is alert and oriented at baseline.   Assessment and Plan: Obesity (BMI 30-39.9) Assessment & Plan: Will reinstate therapy with Wegovy  (semaglutide ) 0.5 mg subcutaneously once weekly.  The patient was advised to continue implementing lifestyle modifications, including a heart-healthy diet and increased physical activity, to support ongoing weight management. Wt Readings from Last 3 Encounters:  06/03/24 172 lb 6.4 oz (78.2  kg)  11/29/23 163 lb (73.9 kg)  11/18/23 160 lb (72.6 kg)     Orders: -     Wegovy ; Inject 0.5 mg into the skin once a week.  Dispense: 6 mL; Refill: 2  Musculoskeletal pain Assessment & Plan: Will initiate therapy with Flexeril  (cyclobenzaprine ) 5 mg orally at bedtime to help manage musculoskeletal pain and improve sleep. Recommended nonpharmacological interventions, including: Gentle stretching and range-of-motion exercises Application of heat or ice as needed Regular low-impact physical activity (e.g., walking, yoga) Proper posture and ergonomic support Physical therapy referral if symptoms persist or worsen   Orders: -     Cyclobenzaprine  HCl; Take 1 tablet (5 mg total) by mouth at bedtime as needed.  Dispense: 30 tablet; Refill: 1  IFG (impaired fasting glucose) -     Hemoglobin A1c  Vitamin D  deficiency -     VITAMIN D  25 Hydroxy (Vit-D Deficiency, Fractures)  TSH (thyroid -stimulating hormone deficiency) -     TSH + free T4  Other hyperlipidemia -     Lipid panel -     CMP14+EGFR -     CBC with Differential/Platelet  Note: This chart has been completed using [employer] (consulting) software, and while attempts have been made to ensure accuracy, certain words and phrases may not be transcribed as intended.    Follow Up Instructions: No follow-ups on file.   I discussed the assessment and treatment plan with the patient. The patient was provided an opportunity to ask questions, and all were answered. The patient agreed with the plan and demonstrated an understanding of the instructions.   The patient was advised to call back or seek  an in-person evaluation if the symptoms worsen or if the condition fails to improve as anticipated.  The above assessment and management plan was discussed with the patient. The patient verbalized understanding of and has agreed to the management plan.   Meade Tara Gerlach, FNP     [1]  Current Outpatient Medications:     cyclobenzaprine  (FLEXERIL ) 5 MG tablet, Take 1 tablet (5 mg total) by mouth at bedtime as needed., Disp: 30 tablet, Rfl: 1   estradiol  (ESTRACE ) 2 MG tablet, Take 1 tablet (2 mg total) by mouth daily., Disp: 30 tablet, Rfl: 11   semaglutide -weight management (WEGOVY ) 0.5 MG/0.5ML SOAJ SQ injection, Inject 0.5 mg into the skin once a week., Disp: 6 mL, Rfl: 2  "

## 2024-06-03 DIAGNOSIS — M7918 Myalgia, other site: Secondary | ICD-10-CM | POA: Insufficient documentation

## 2024-06-03 NOTE — Assessment & Plan Note (Signed)
 Will reinstate therapy with Wegovy  (semaglutide ) 0.5 mg subcutaneously once weekly.  The patient was advised to continue implementing lifestyle modifications, including a heart-healthy diet and increased physical activity, to support ongoing weight management. Wt Readings from Last 3 Encounters:  06/03/24 172 lb 6.4 oz (78.2 kg)  11/29/23 163 lb (73.9 kg)  11/18/23 160 lb (72.6 kg)

## 2024-06-03 NOTE — Assessment & Plan Note (Signed)
 Will initiate therapy with Flexeril  (cyclobenzaprine ) 5 mg orally at bedtime to help manage musculoskeletal pain and improve sleep. Recommended nonpharmacological interventions, including: Gentle stretching and range-of-motion exercises Application of heat or ice as needed Regular low-impact physical activity (e.g., walking, yoga) Proper posture and ergonomic support Physical therapy referral if symptoms persist or worsen

## 2024-06-04 ENCOUNTER — Other Ambulatory Visit: Payer: Self-pay

## 2024-06-05 ENCOUNTER — Other Ambulatory Visit (HOSPITAL_COMMUNITY): Payer: Self-pay

## 2024-06-07 ENCOUNTER — Other Ambulatory Visit: Payer: Self-pay

## 2024-06-11 ENCOUNTER — Other Ambulatory Visit: Payer: Self-pay

## 2024-06-11 ENCOUNTER — Encounter: Payer: Self-pay | Admitting: Family Medicine

## 2024-06-12 ENCOUNTER — Telehealth: Payer: Self-pay

## 2024-06-12 ENCOUNTER — Other Ambulatory Visit (HOSPITAL_COMMUNITY): Payer: Self-pay

## 2024-06-12 NOTE — Telephone Encounter (Signed)
 Pharmacy Patient Advocate Encounter   Received notification from Patient Advice Request messages that prior authorization for Wegovy  0.5mg /0.39ml is required/requested.   Insurance verification completed.   The patient is insured through [facility].   Per test claim: PA required; PA submitted to above mentioned insurance via Latent Key/confirmation #/EOC [id number] Status is pending

## 2024-06-15 ENCOUNTER — Telehealth: Payer: Self-pay

## 2024-06-15 NOTE — Telephone Encounter (Signed)
 Left voice mail

## 2024-06-15 NOTE — Telephone Encounter (Signed)
 Copied from CRM #[id number]. Topic: Clinical - Request for Lab/Test Order >> Jun 14, 2024  4:35 PM Tara Gates wrote: Reason for CRM: Patient requesting to have lab orders sent to [facility] Address: [address] [place], [place] [address] Phone: [phone] She stated that it would be easier for her.  Please contact you once done so she'll know when to go.

## 2024-09-28 ENCOUNTER — Ambulatory Visit: Payer: Self-pay
